# Patient Record
Sex: Female | Born: 1965 | Race: White | Hispanic: No | Marital: Single | Smoking: Current every day smoker
Health system: Southern US, Community
[De-identification: ages and names within clinical notes are randomized; demographics above are authoritative.]

## PROBLEM LIST (undated history)

## (undated) DIAGNOSIS — G43909 Migraine, unspecified, not intractable, without status migrainosus: Secondary | ICD-10-CM

## (undated) HISTORY — PX: BREAST ENHANCEMENT SURGERY: SHX7

## (undated) HISTORY — PX: OTHER SURGICAL HISTORY: SHX169

## (undated) HISTORY — PX: ABDOMINAL HYSTERECTOMY: SHX81

## (undated) HISTORY — PX: BREAST EXCISIONAL BIOPSY: SUR124

## (undated) HISTORY — PX: CHOLECYSTECTOMY: SHX55

---

## 1995-01-04 HISTORY — PX: AUGMENTATION MAMMAPLASTY: SUR837

## 1997-06-30 ENCOUNTER — Ambulatory Visit (HOSPITAL_COMMUNITY): Admission: RE | Admit: 1997-06-30 | Discharge: 1997-06-30 | Payer: Self-pay | Admitting: Urology

## 1998-02-11 ENCOUNTER — Ambulatory Visit (HOSPITAL_BASED_OUTPATIENT_CLINIC_OR_DEPARTMENT_OTHER): Admission: RE | Admit: 1998-02-11 | Discharge: 1998-02-11 | Payer: Self-pay | Admitting: Urology

## 1998-11-18 ENCOUNTER — Ambulatory Visit (HOSPITAL_COMMUNITY): Admission: RE | Admit: 1998-11-18 | Discharge: 1998-11-18 | Payer: Self-pay | Admitting: Urology

## 1999-09-15 ENCOUNTER — Ambulatory Visit (HOSPITAL_COMMUNITY): Admission: RE | Admit: 1999-09-15 | Discharge: 1999-09-15 | Payer: Self-pay | Admitting: Urology

## 2000-09-27 ENCOUNTER — Ambulatory Visit (HOSPITAL_BASED_OUTPATIENT_CLINIC_OR_DEPARTMENT_OTHER): Admission: RE | Admit: 2000-09-27 | Discharge: 2000-09-27 | Payer: Self-pay | Admitting: Urology

## 2001-06-12 ENCOUNTER — Emergency Department (HOSPITAL_COMMUNITY): Admission: EM | Admit: 2001-06-12 | Discharge: 2001-06-12 | Payer: Self-pay | Admitting: Emergency Medicine

## 2001-06-12 ENCOUNTER — Encounter: Payer: Self-pay | Admitting: Emergency Medicine

## 2001-09-19 ENCOUNTER — Ambulatory Visit (HOSPITAL_BASED_OUTPATIENT_CLINIC_OR_DEPARTMENT_OTHER): Admission: RE | Admit: 2001-09-19 | Discharge: 2001-09-19 | Payer: Self-pay | Admitting: Urology

## 2002-05-29 ENCOUNTER — Ambulatory Visit (HOSPITAL_BASED_OUTPATIENT_CLINIC_OR_DEPARTMENT_OTHER): Admission: RE | Admit: 2002-05-29 | Discharge: 2002-05-29 | Payer: Self-pay | Admitting: Urology

## 2003-03-24 ENCOUNTER — Ambulatory Visit (HOSPITAL_BASED_OUTPATIENT_CLINIC_OR_DEPARTMENT_OTHER): Admission: RE | Admit: 2003-03-24 | Discharge: 2003-03-24 | Payer: Self-pay | Admitting: Urology

## 2003-11-05 ENCOUNTER — Encounter: Admission: RE | Admit: 2003-11-05 | Discharge: 2003-11-05 | Payer: Self-pay | Admitting: Obstetrics and Gynecology

## 2004-01-15 ENCOUNTER — Ambulatory Visit (HOSPITAL_BASED_OUTPATIENT_CLINIC_OR_DEPARTMENT_OTHER): Admission: RE | Admit: 2004-01-15 | Discharge: 2004-01-15 | Payer: Self-pay | Admitting: Urology

## 2004-06-07 ENCOUNTER — Encounter: Admission: RE | Admit: 2004-06-07 | Discharge: 2004-06-07 | Payer: Self-pay | Admitting: Obstetrics and Gynecology

## 2004-06-23 ENCOUNTER — Encounter (INDEPENDENT_AMBULATORY_CARE_PROVIDER_SITE_OTHER): Payer: Self-pay | Admitting: Specialist

## 2004-06-23 ENCOUNTER — Ambulatory Visit (HOSPITAL_BASED_OUTPATIENT_CLINIC_OR_DEPARTMENT_OTHER): Admission: RE | Admit: 2004-06-23 | Discharge: 2004-06-23 | Payer: Self-pay | Admitting: General Surgery

## 2004-06-23 ENCOUNTER — Ambulatory Visit (HOSPITAL_COMMUNITY): Admission: RE | Admit: 2004-06-23 | Discharge: 2004-06-23 | Payer: Self-pay | Admitting: General Surgery

## 2004-12-08 ENCOUNTER — Encounter: Admission: RE | Admit: 2004-12-08 | Discharge: 2004-12-08 | Payer: Self-pay | Admitting: Obstetrics and Gynecology

## 2005-01-19 ENCOUNTER — Ambulatory Visit (HOSPITAL_BASED_OUTPATIENT_CLINIC_OR_DEPARTMENT_OTHER): Admission: RE | Admit: 2005-01-19 | Discharge: 2005-01-19 | Payer: Self-pay | Admitting: Urology

## 2005-11-09 ENCOUNTER — Ambulatory Visit (HOSPITAL_COMMUNITY): Admission: RE | Admit: 2005-11-09 | Discharge: 2005-11-09 | Payer: Self-pay | Admitting: Urology

## 2006-11-01 ENCOUNTER — Ambulatory Visit (HOSPITAL_BASED_OUTPATIENT_CLINIC_OR_DEPARTMENT_OTHER): Admission: RE | Admit: 2006-11-01 | Discharge: 2006-11-01 | Payer: Self-pay | Admitting: Urology

## 2007-05-23 ENCOUNTER — Encounter: Admission: RE | Admit: 2007-05-23 | Discharge: 2007-05-23 | Payer: Self-pay | Admitting: Obstetrics and Gynecology

## 2007-10-24 ENCOUNTER — Emergency Department (HOSPITAL_COMMUNITY): Admission: EM | Admit: 2007-10-24 | Discharge: 2007-10-24 | Payer: Self-pay | Admitting: Emergency Medicine

## 2007-12-24 ENCOUNTER — Ambulatory Visit (HOSPITAL_BASED_OUTPATIENT_CLINIC_OR_DEPARTMENT_OTHER): Admission: RE | Admit: 2007-12-24 | Discharge: 2007-12-24 | Payer: Self-pay | Admitting: Urology

## 2008-07-21 ENCOUNTER — Ambulatory Visit: Payer: Self-pay | Admitting: Diagnostic Radiology

## 2008-07-21 ENCOUNTER — Emergency Department (HOSPITAL_BASED_OUTPATIENT_CLINIC_OR_DEPARTMENT_OTHER): Admission: EM | Admit: 2008-07-21 | Discharge: 2008-07-21 | Payer: Self-pay | Admitting: Emergency Medicine

## 2008-07-29 ENCOUNTER — Ambulatory Visit (HOSPITAL_BASED_OUTPATIENT_CLINIC_OR_DEPARTMENT_OTHER): Admission: RE | Admit: 2008-07-29 | Discharge: 2008-07-29 | Payer: Self-pay | Admitting: Otolaryngology

## 2008-08-20 ENCOUNTER — Encounter: Admission: RE | Admit: 2008-08-20 | Discharge: 2008-08-20 | Payer: Self-pay | Admitting: Obstetrics and Gynecology

## 2009-02-09 ENCOUNTER — Ambulatory Visit (HOSPITAL_BASED_OUTPATIENT_CLINIC_OR_DEPARTMENT_OTHER): Admission: RE | Admit: 2009-02-09 | Discharge: 2009-02-09 | Payer: Self-pay | Admitting: Urology

## 2010-05-18 NOTE — Op Note (Signed)
NAMEJASMIN, WINBERRY NO.:  [id number]   MEDICAL RECORD NO.:  [id number]          PATIENT TYPE:  AMB   LOCATION:  [facility]                         FACILITY:  [facility]   PHYSICIAN:  Valetta Fuller, M.D.  DATE OF BIRTH:  01/12/1965   DATE OF PROCEDURE:  DATE OF DISCHARGE:                               OPERATIVE REPORT   PREOPERATIVE DIAGNOSIS:  Interstitial cystitis.   POSTOPERATIVE DIAGNOSIS:  Interstitial cystitis.   PROCEDURE PERFORMED:  Cystoscopy with hydraulic overdistention of the  bladder and installation of Clorpactin and Marcaine.   SURGEON:  Valetta Fuller, M.D.   ANESTHESIA:  General.   INDICATIONS:  Ms. Buckingham has had a very longstanding history of  interstitial cystitis.  She has undergone a multitude of cystoscopies  with hydraulic overdistention of her bladder.  She typically has her  bladder over-distended approximately once per year and this generally  results in nice durable response.  Her last hydraulic overdistention was  in October of 2008.  She recently presented with a flare of her  interstitial cystitis symptoms and requested a repeat hydraulic  overdistention of her bladder.  She presents now for that procedure.   The appropriate time-out was performed and the patient received  perioperative antibiotics.   TECHNIQUE AND FINDINGS:  The patient was brought to the operating room  where she had successful induction of general anesthesia.  She was  placed in the mid-lithotomy position and prepped and draped in usual  manner.  Cystoscopy was performed with an unremarkable bladder  initially.  She underwent hydraulic overdistention of her bladder to 100  cm of water pressure for 5 minutes.  Capacity was consistent at around  900 mL.  The patient did however have diffuse glomerular hemorrhaging  that I would deem 3-4+.  At the completion of her bladder decompression  the affluent from her bladder was grossly bloody.  She underwent a  repeat  hydraulic overdistention for an additional 2-3 minutes.  Her  bladder was then decompressed.  A red Robinson catheter was placed and  she had gentle instillation of standard Clorpactin solution, a few 100  mL at a time.  Her bladder was then irrigated and Marcaine was instilled  along with a B and O suppository.  The patient appeared to tolerate the  procedure well and there were no obvious complications or difficulties.  She was brought to recovery room in stable condition.      Valetta Fuller, M.D.  Electronically Signed     DSG/MEDQ  D:  12/24/2007  T:  12/25/2007  Job:  [id number]
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
## 2010-05-18 NOTE — Op Note (Signed)
[name], [name]               ACCOUNT NO.:  [id number]   MEDICAL RECORD NO.:  [id number]          PATIENT TYPE:  AMB   LOCATION:  [facility]                         FACILITY:  [facility]   PHYSICIAN:  Valetta Fuller, M.D.  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   PROCEDURE PERFORMED:  Cystoscopy with hydraulic overdistention of  bladder and installation of Clorpactin and Marcaine.   SURGEON:  Valetta Fuller, M.D.   ANESTHESIA:  General.   INDICATIONS:  [name] has had very longstanding chronic painful  bladder syndrome.  She has undergone 8 to 10 hydraulic overdistentions  of the bladder in the past, all of which have resulted in marked  symptomatic improvement which is generally durable for 9-12 months.  She  has had a recent flare-up of her symptoms and requested a repeat  hydraulic overdistention of her bladder with installation.   TECHNIQUE AND FINDINGS:  The patient was brought to the operating room  where she had successful induction of general anesthesia.  She was  placed lithotomy position, prepped and draped in usual manner.  Cystoscopy was unremarkable.  She underwent hydraulic overdistention of  her bladder to 1 meter of water pressure for five minutes.  Capacity was  approximately 800 to 900 mL.  There were diffuse glomerular hemorrhages.  A second distention was done for an additional few minutes.  The bladder  was then drained.  Clorpactin was instilled at a standard concentration  a few hundred mL  at a time under gravity drainage.  The bladder was  then emptied and Marcaine was instilled.  The patient appeared to  tolerate the procedure well and there were no obvious complications.  She was brought to recovery room in stable condition.           ______________________________  Valetta Fuller, M.D.  Electronically Signed     DSG/MEDQ  D:  11/08/2006  T:  11/09/2006  Job:  [id number]

## 2010-05-21 NOTE — Op Note (Signed)
   NAME:  Savannah [name], Savannah [name]                         ACCOUNT NO.:  [id number]   MEDICAL RECORD NO.:  [id number]                   PATIENT TYPE:  AMB   LOCATION:  [facility]                                 FACILITY:  [facility]   PHYSICIAN:  Valetta Fuller, M.D.               DATE OF BIRTH:  September 14, 1965   DATE OF PROCEDURE:  06/20/2002  DATE OF DISCHARGE:  05/29/2002                                 OPERATIVE REPORT   PREOPERATIVE DIAGNOSIS:  Interstitial cystitis.   POSTOPERATIVE DIAGNOSIS:  Interstitial cystitis.   PROCEDURE PERFORMED:  1. Cystoscopy.  2. Hydraulic overdistention of the bladder with instillation of Clorpactin.   SURGEON:  Valetta Fuller, M.D.   ANESTHESIA:  General.   INDICATIONS:  Ms. Kuntzman is a 46 year old female with an 8-10 year history  of interstitial cystitis.  Approximately every 9-12 months, she has a  cystoscopy and hydraulic overdistention of the bladder with usual marked  improvement in her voiding symptoms.  She has had a recent flare-up of her  symptoms and requested repeat procedure.   TECHNIQUE AND FINDINGS:  The patient was brought to the operating room where  she had successful induction of general anesthesia.  She was placed in  lithotomy position and prepped and draped in the usual manner.  Cystoscopy  revealed a normal bladder initially.  She underwent a hydraulic  overdistension to 80 cardiomyopathy of water pressure.  The capacity is  measured at 800 mL.  She did have 3-4+ diffuse glomerulations with typical  findings and similar capacity to previous procedures.  At the completion of  the procedure, she had instillation of Clorpactin along with some Marcaine  and Pyridium.  She appeared to tolerate the procedure well, and there were  no obvious complications.                                               Valetta Fuller, M.D.    DSG/MEDQ  D:  06/20/2002  T:  06/20/2002  Job:  [id number]

## 2010-05-21 NOTE — Op Note (Signed)
[name], [name]               ACCOUNT NO.:  [id number]   MEDICAL RECORD NO.:  [id number]          PATIENT TYPE:  AMB   LOCATION:  DSC                          FACILITY:  [facility]   PHYSICIAN:  Rose Phi. Maple Hudson, M.D.   DATE OF BIRTH:  1965-05-30   DATE OF PROCEDURE:  06/23/2004  DATE OF DISCHARGE:                                 OPERATIVE REPORT   PREOPERATIVE DIAGNOSIS:  Fibroadenoma of the left breast.   POSTOPERATIVE DIAGNOSIS:  Fibroadenoma of the left breast.   OPERATION:  Excision of left breast mass.   SURGEON:  Savannah [name], M.D.   ANESTHESIA:  General.   OPERATIVE PROCEDURE:  After suitable general anesthesia was induced, the  patient was placed in a supine position with the arms extended on the arm  board.  The left breast was prepped and draped in the usual fashion.  The  palpable nodule was underneath the areola at about the 5 o'clock position.  A short curved circumareolar incision was made and I dissected up an areolar  flap so I could palpate the nodule.  I then excised the nodule with the  cautery.  After excising it, we had good hemostasis.  I infiltrated the  tissue at 0.25% Marcaine.  The deeper tissue was then closed with 3-0 Vicryl  and the skin with subcuticular 4-0 Monocryl and Steri-Strips.  Dressing  applied.  The patient was transferred to recovery room in satisfactory  condition, having tolerated the procedure well.       PRY/MEDQ  D:  06/23/2004  T:  06/23/2004  Job:  [id number]

## 2010-05-21 NOTE — Op Note (Signed)
NAME:  Savannah [name], Savannah [name]                         ACCOUNT NO.:  [id number]   MEDICAL RECORD NO.:  [id number]                   PATIENT TYPE:  AMB   LOCATION:  [facility]                                 FACILITY:  [facility]   PHYSICIAN:  Valetta Fuller, M.D.               DATE OF BIRTH:  11/12/1965   DATE OF PROCEDURE:  03/24/2003  DATE OF DISCHARGE:                                 OPERATIVE REPORT   PREOPERATIVE DIAGNOSIS:  Interstitial cystitis.   POSTOPERATIVE DIAGNOSIS:  Interstitial cystitis.   OPERATION/PROCEDURE:  1. Cystoscopy.  2. Hydraulic overdistention of the bladder.  3. Instillation of Clorpactin, Marcaine and Pyridium.   SURGEON:  Valetta Fuller, M.D.   ANESTHESIA:  General.   INDICATIONS:  Mrs. Laidlaw is a 45 year old female with about a 10-year  history of interstitial cystitis.  She has undergone approximately 9-10  hydraulic overdistentions of her bladder in the past.  She has had a mildly  reduced bladder capacity that has not really changed much over the last 8-10  years.  She typically gets excellent response to hydraulic overdistentions  which tend to be durable for between 9-12 months.  She has had a typical  flare-up of her symptoms recently and requested a repeat procedure.   DESCRIPTION OF PROCEDURE:  The patient was brought to the operating room  where she had the successful induction of general anesthesia.  She was  placed in the lithotomy position and prepped and draped in the usual manner.  Cystoscopy initially revealed no abnormalities.  She underwent hydraulic  overdistention to approximately 87 cm of water pressure for five minutes.  Capacity was stable at 800 mL.  She continued to have 3-4+ very severe,  diffuse, glomerular hemorrhaging.  A repeat distention was performed.  The  bladder was then emptied.  Clorpactin was instilled under gravity drainage  under a standard concentration and then drained from her bladder.  Marcaine  and Pyridium were placed  at the end of the procedure.  The patient appeared  to tolerate the procedure well.  There were no obvious complications.                                               Valetta Fuller, M.D.    DSG/MEDQ  D:  03/24/2003  T:  03/24/2003  Job:  [id number]

## 2010-05-21 NOTE — Op Note (Signed)
NAMEJAYDALEE, BARDWELL               ACCOUNT NO.:  [id number]   MEDICAL RECORD NO.:  [id number]          PATIENT TYPE:  AMB   LOCATION:  [facility]                         [facility]ACILITY:  [facility]   PHYSICIAN:  Valetta Fuller, M.D.  DATE OF BIRTH:  Jun 28, 1965   DATE OF PROCEDURE:  DATE OF DISCHARGE:                                 OPERATIVE REPORT   PREOPERATIVE DIAGNOSIS:  Interstitial cystitis.   POSTOPERATIVE DIAGNOSIS:  Interstitial cystitis.   PROCEDURE PERFORMED:  Cystoscopy, hydraulic overdistention of the bladder,  instillation of Clorpactin and Marcaine.   SURGEON:  Valetta Fuller, M.D.   ANESTHESIA:  General.   INDICATIONS:  Ms. Kopec is a 45 year old female who has had at least a 10-  year history of interstitial cystitis.  She has had approximately 10  hydraulic overdistentions of her bladder in the past.  She has been noted to  have a mildly reduced bladder capacity and fairly severe diffuse glomerular  hemorrhaging.  She typically gets an excellent response to her cysto and HOD  which typically last for 9-12 months.  She has had a recent flareup of her  symptoms and requested a repeat procedure.  This was last done in March  2005.   TECHNIQUE AND FINDINGS:  The patient was brought to the operating room,  where she had successful induction of general anesthesia.  She was placed in  the lithotomy position and prepped and draped in the usual manner.  Cystoscopy revealed unremarkable bladder initially.  She underwent hydraulic  overdistention of her bladder to approximately 100 cmH2O pressure for five  minutes.  Capacity was measured at 900 cc, which was stable to slightly  increased.  She did have very severe 4+ diffuse glomerular hemorrhaging,  with very bloody effluent from her bladder.  A repeat distention was  performed for an additional two to three minutes.  The bladder was then  emptied.  A red Robinson catheter was placed, and Clorpactin was instilled  under gravity  drainage a few hundred cc at a time.  At the end of that, some  Marcaine was placed in her bladder for symptomatic relief.  The patient  appeared to tolerate the procedure well.  There were no obvious  complications.      DSG/MEDQ  D:  01/15/2004  T:  01/15/2004  Job:  [id number]

## 2010-05-21 NOTE — Op Note (Signed)
   NAME:  Savannah [name], Savannah [name]                         ACCOUNT NO.:  [id number]   MEDICAL RECORD NO.:  [id number]                   PATIENT TYPE:  AMB   LOCATION:  [facility]                                 FACILITY:  [facility]   PHYSICIAN:  Valetta Fuller, M.D.               DATE OF BIRTH:  June 10, 1965   DATE OF PROCEDURE:  09/19/2001  DATE OF DISCHARGE:                                 OPERATIVE REPORT   PREOPERATIVE DIAGNOSIS:  Interstitial cystitis.   POSTOPERATIVE DIAGNOSIS:  Interstitial cystitis.   PROCEDURE PERFORMED:  Urethral dilation, cystoscopy, hydraulic  overdistention of the bladder with instillation of Clorpactin, Pyridium and  Marcaine..   SURGEON:  Valetta Fuller, M.D.   ANESTHESIA:  General.   INDICATIONS:  The patient is a 45 year old female.  She has been diagnosed  with interstitial cystitis for approximately 5-6 years.  She has undergone  five previous hydraulic overdistentions of her bladder.  This has always  shown the same findings, which is fairly diffuse glomerular hemorrhages,  with slightly reduced bladder capacity.  She has already responded extremely  well, and the durability of her response has tended to be 9-12 months.  Approximately one year ago she had this procedure.  She recently had a  recurrence of her symptoms, with a negative urine and wanted an additional  procedure performed.   TECHNIQUE AND FINDINGS:  The patient was brought to the operating room,  where she had successful induction of general anesthesia.  She was prepped  and draped in the usual manner.  Urethral dilation was done to 32-French.  Cystoscopy revealed no abnormalities.  She underwent hydraulic  overdistention to 80 cm of water pressure for five minutes.  Bladder  capacity was 800 to 850 cc.  She had, what I would call, 4+ glomerular  hemorrhaging, with a fairly bloody effluent from her bladder.  We then  instilled Clorpactin to gravity drainage a few 100 cc at a time, under a  standard concentration and then some Pyridium and Marcaine.  The patient  appeared to tolerated the procedure well.  She had no obvious complications  or problems.                                               Valetta Fuller, M.D.    DSG/MEDQ  D:  09/19/2001  T:  09/19/2001  Job:  [id number]

## 2010-05-21 NOTE — Op Note (Signed)
[facility]  Patient:    Savannah [name], Savannah [name] Visit Number: [id number] MRN: [id number]          Service Type: NES Location: [facility] Attending Physician:  Thermon Leyland Dictated by:   Barron Alvine, M.D. Proc. Date: 09/27/00 Admit Date:  09/27/2000                             Operative Report  PREOPERATIVE DIAGNOSIS:  Interstitial cystitis.  POSTOPERATIVE DIAGNOSIS:  Interstitial cystitis.  OPERATION PERFORMED:  Urethral dilation, cystoscopy, hydraulic overdistention of the bladder, instillation of Clorpactin, Marcaine and Pyridium.  SURGEON:  Barron Alvine, M.D.  ANESTHESIA:  General.  INDICATIONS FOR PROCEDURE:  Ms. Wilbourne is a 45 year old female.  She has approximately a 5 to 6-year history of interstitial cystitis, she has undergone approximately four to five hydraulic overdistentions in her bladder in the past with excellent symptom relief that is usually of 9 to 12 months durability.  She has had recurrent symptoms and requested another hydraulic overdistention of the bladder.  DESCRIPTION OF PROCEDURE:  The patient was brought to the operating room where she had successful induction of general anesthesia.  She was placed in lithotomy position and was prepped and draped in the usual manner.  Urethral dilation was performed to 32 [place].  The patient then underwent cystoscopy which was unremarkable.  She had hydraulic overdistention of her bladder performed to approximately 80 cm of water pressure. The distention was held for five minutes.  Her capacity was measured at 800 cc.  Her bladder actually had much fewer in the way of glomerulations than she has in the past.  We then repeated the distention.  We then emptied the bladder and instilled Clorpactin in a standard concentration under gravity instillation a few hundred ccs at a time.  At the end of the procedure, we placed some Marcaine and Pyridium.  She was brought to the recovery room  in stable condition. Dictated by:   Barron Alvine, M.D. Attending Physician:  Thermon Leyland DD:  09/27/00 TD:  09/27/00 Job: [id number]

## 2010-05-21 NOTE — Op Note (Signed)
Savannah [name], Savannah [name]               ACCOUNT NO.:  [id number]   MEDICAL RECORD NO.:  [id number]          PATIENT TYPE:  AMB   LOCATION:  [facility]                         FACILITY:  [facility]   PHYSICIAN:  Valetta Fuller, M.D.  DATE OF BIRTH:  09-Jul-1965   DATE OF PROCEDURE:  01/19/2005  DATE OF DISCHARGE:                                 OPERATIVE REPORT   PREOPERATIVE DIAGNOSIS:  Interstitial cystitis.   POSTOPERATIVE DIAGNOSIS:  Interstitial cystitis.   PROCEDURE PERFORMED:  Cystoscopy, hydraulic over-distension of the bladder  and installation of Clorpactin and Marcaine.   SURGEON:  Valetta Fuller, M.D.   ANESTHESIA:  General.   INDICATIONS:  [name] is a 45 year old female who has a 10 to 12-year  history of interstitial cystitis. She has had at least 10 hydraulic over-  distensions of her bladder in the past. She has had moderately reduced  bladder capacity was severe diffuse glomerular hemorrhaging. She gets an  excellent response hydraulic over-distension which is typically durable for  9-12 months. Her last  hydraulic over-distension was done a year ago. She  requested repeat procedure because of flare-up of her symptoms.   TECHNIQUE AND FINDINGS:  The patient was brought to the operating room where  she had successful induction of general anesthesia. She was placed in  lithotomy position, prepped and draped usual manner. Initial cystoscopy was  unremarkable. She underwent hydraulic over-stents of the bladder to 100 cm  of water pressure for five minutes. Capacity was 850 mL, which is fairly  stable. The patient had diffuse 4+ glomerular hemorrhaging with very bloody  affluent from her bladder. We did a repeat hydraulic over-distension and  then drained her bladder. We instilled Clorpactin under a standard  concentration, a few 100 mL at that time. Marcaine was instilled in her  bladder. She appeared to tolerate the procedure well.  There were no obvious   complications.           ______________________________  Valetta Fuller, M.D.  Electronically Signed     DSG/MEDQ  D:  01/19/2005  T:  01/19/2005  Job:  [id number]

## 2010-05-21 NOTE — Op Note (Signed)
Savannah [name], Savannah [name] NO.:  [id number]   MEDICAL RECORD NO.:  [id number]          PATIENT TYPE:  AMB   LOCATION:  DAY                          FACILITY:  [facility]   PHYSICIAN:  Valetta Fuller, M.D.  DATE OF BIRTH:  [birth year]   DATE OF PROCEDURE:  11/09/2005  DATE OF DISCHARGE:                               OPERATIVE REPORT   PREOPERATIVE DIAGNOSIS:  Chronic interstitial cystitis/pelvic pain  syndrome.   POSTOPERATIVE DIAGNOSIS:  Chronic interstitial cystitis/pelvic pain  syndrome.   PROCEDURE PERFORMED:  Urethral dilation, cystoscopy with hydraulic  overdistention of bladder and installation of Clorpactin and Marcaine.   SURGEON:  Valetta Fuller, M.D.   ANESTHESIA:  General.   INDICATIONS:  [name] is a 45 year old female who has had diagnosis  of interstitial cystitis for least 10 years.  She has had a multitude of  hydraulic overdistentions of her bladder which help her symptoms greatly  with the durability generally between nine and 12 months.  She has had a  flare-up of her symptoms.  Her last hydraulic overdistention was  approximately nine to 10 months ago.  She requested another procedure.   TECHNIQUE AND FINDINGS:  The patient was brought to the operating room  where she had successful induction of general anesthesia.  She was  placed in lithotomy position and prepped, draped in the usual manner.  Urethral dilation was performed and no evidence of real stenosis was  noted.  Initial cystoscopy was unremarkable.  She underwent hydraulic  overdistention of bladder at 100 cm of water pressure for 5 minutes.  Capacity was measured at 700-750 mL.  She had widespread diffuse grade 4  glomerular hemorrhaging with a bloody effluent of urine at the  completion of the procedure.  She underwent a repeat hydraulic  overdistention for an additional few minutes and then her bladder was  emptied.  Clorpactin was instilled in her bladder a few hundred ccs at  a  time under gravity drainage.  Her bladder was then drained and Marcaine  was placed.  The patient appeared to tolerate the procedure well and no  obvious complications or difficulties.           ______________________________  Valetta Fuller, M.D.  Electronically Signed     DSG/MEDQ  D:  11/10/2005  T:  11/10/2005  Job:  [id number]

## 2010-05-21 NOTE — Op Note (Signed)
[facility]  Patient:    Savannah [name], Savannah [name]                      MRN: [id number] Proc. Date: 09/15/99 Adm. Date:  [id number] Attending:  Thermon Leyland                           Operative Report  PREOPERATIVE DIAGNOSES:  Interstitial cystitis.  POSTOPERATIVE DIAGNOSES:  Interstitial cystitis.  PROCEDURE:  Urethral dilation, cystoscopy, hydraulic over distention of the bladder, installation of Clorpactin and Marcaine.  SURGEON:  Dr. Isabel Caprice.  ANESTHESIA:  General.  INDICATIONS FOR PROCEDURE:  Savannah [name] is a 45 year old with longstanding interstitial cystitis. She has had a similar procedure approximately 5 times in the past all with good response. She has had a flare-up of her symptoms and is now presenting for a repeat procedure.  TECHNIQUE:  The patient was brought to the operating room where she had the successful induction of general anesthesia. She was then placed in lithotomy position and prepped and draped in the usual manner. Her urethra was gently dilated to 30 french. Cystoscopy was then performed. She underwent hydraulic over distention to 80 cm of water pressure which was held for 5 minutes. Bladder capacity was measured at 700 cc which was fairly consistent with previous evaluations. Upon emptying her bladder, she had diffuse 3-4+ glomerulations. She underwent a repeat hydrodistention. The patient then had installation of Marcaine in her bladder. The procedure was tolerated well and she was brought to the recovery room in stable condition. DD:  09/15/99 TD:  09/16/99 Job: [id number]

## 2010-10-04 LAB — CBC
Hemoglobin: 15.1 — ABNORMAL HIGH
MCHC: 34.2
MCV: 93
RBC: 4.74
WBC: 6.8

## 2010-10-04 LAB — COMPREHENSIVE METABOLIC PANEL
ALT: 21
Albumin: 4.4
CO2: 27
Calcium: 9.7
GFR calc Af Amer: >60
Glucose, Bld: 98
Total Bilirubin: 1

## 2010-10-04 LAB — DIFFERENTIAL: Lymphocytes Relative: 36

## 2010-10-07 LAB — POCT HEMOGLOBIN-HEMACUE: Hemoglobin: 13.3 g/dL (ref 12.0–15.0)

## 2010-10-13 LAB — POCT HEMOGLOBIN-HEMACUE: Operator id: 268271

## 2011-05-02 ENCOUNTER — Other Ambulatory Visit: Payer: Self-pay | Admitting: Obstetrics and Gynecology

## 2011-05-02 DIAGNOSIS — Z1231 Encounter for screening mammogram for malignant neoplasm of breast: Secondary | ICD-10-CM

## 2011-05-03 ENCOUNTER — Ambulatory Visit (INDEPENDENT_AMBULATORY_CARE_PROVIDER_SITE_OTHER): Payer: Self-pay | Admitting: *Deleted

## 2011-05-03 ENCOUNTER — Other Ambulatory Visit: Payer: Self-pay | Admitting: Obstetrics and Gynecology

## 2011-05-03 ENCOUNTER — Ambulatory Visit (HOSPITAL_COMMUNITY): Payer: Self-pay | Attending: Obstetrics and Gynecology

## 2011-05-03 VITALS — BP 105/70 | HR 72 | Temp 97.1°F | Ht 63.0 in | Wt 112.7 lb

## 2011-05-03 DIAGNOSIS — N6312 Unspecified lump in the right breast, upper inner quadrant: Secondary | ICD-10-CM

## 2011-05-03 DIAGNOSIS — Z1239 Encounter for other screening for malignant neoplasm of breast: Secondary | ICD-10-CM

## 2011-05-03 DIAGNOSIS — N6310 Unspecified lump in the right breast, unspecified quadrant: Secondary | ICD-10-CM

## 2011-05-03 DIAGNOSIS — N63 Unspecified lump in unspecified breast: Secondary | ICD-10-CM

## 2011-05-03 NOTE — Progress Notes (Signed)
Complaints of right breast lump.  Pap Smear:    Pap smear not performed today. Per patient last Pap smear was March 2013 by Dr. Jackelyn Knife and normal. Per patient she has no history of abnormal Pap smears. Patient has a history of a TAH August 1996 due to chronic pelvic pain. No Pap smear results in EPIC.   Physical exam: Breasts Breasts symmetrical. No skin abnormalities bilateral breasts. No nipple retraction bilateral breasts. No nipple discharge bilateral breasts. No lymphadenopathy. No lumps palpated left breast. Palpated lump in the right breast around 1 o'clock 6 cm from the areola. No complaints of pain or tenderness on exam. Patient referred to the [facility] for Diagnostic Mammogram and possible right breast ultrasound. Appointment scheduled for Wednesday, May 04, 2011 at 1430.     Pelvic/Bimanual No Pap smear completed today since patient has a history of a TAH for benign reasons. Pap smear not indicated per BCCCP guidelines.

## 2011-05-03 NOTE — Patient Instructions (Signed)
Taught patient how to perform BSE and gave educational materials to take home. Let patient know she will not need any further Pap smears due to history of TAH for benign reasons. Talked with patient about quitting smoking. Told patient about free smoking cessation classes offered at the [facility]. Patient stated not ready to quit now. Patient referred to the [facility] for Diagnostic Mammogram and possible right breast ultrasound. Appointment scheduled for Wednesday, May 04, 2011 at 1430. Patient aware of appointment and will be there. Let patient know will follow up with her within the next couple weeks with results. Patient verbalized understanding.

## 2011-05-04 ENCOUNTER — Ambulatory Visit
Admission: RE | Admit: 2011-05-04 | Discharge: 2011-05-04 | Disposition: A | Discharge status: Home / Self Care | Payer: No Typology Code available for payment source | Source: Ambulatory Visit | Attending: Obstetrics and Gynecology | Admitting: Obstetrics and Gynecology

## 2011-05-04 DIAGNOSIS — N6312 Unspecified lump in the right breast, upper inner quadrant: Secondary | ICD-10-CM

## 2011-05-20 ENCOUNTER — Other Ambulatory Visit: Payer: Self-pay | Admitting: Obstetrics and Gynecology

## 2011-05-20 DIAGNOSIS — R928 Other abnormal and inconclusive findings on diagnostic imaging of breast: Secondary | ICD-10-CM

## 2014-07-21 ENCOUNTER — Emergency Department (HOSPITAL_BASED_OUTPATIENT_CLINIC_OR_DEPARTMENT_OTHER)
Admission: EM | Admit: 2014-07-21 | Discharge: 2014-07-21 | Disposition: A | Discharge status: Home / Self Care | Payer: Self-pay | Attending: Emergency Medicine | Admitting: Emergency Medicine

## 2014-07-21 ENCOUNTER — Encounter (HOSPITAL_BASED_OUTPATIENT_CLINIC_OR_DEPARTMENT_OTHER): Payer: Self-pay | Admitting: *Deleted

## 2014-07-21 DIAGNOSIS — M5442 Lumbago with sciatica, left side: Secondary | ICD-10-CM | POA: Insufficient documentation

## 2014-07-21 DIAGNOSIS — Z720 Tobacco use: Secondary | ICD-10-CM | POA: Insufficient documentation

## 2014-07-21 LAB — URINALYSIS, ROUTINE W REFLEX MICROSCOPIC
BILIRUBIN URINE: NEGATIVE
Glucose, UA: NEGATIVE mg/dL
HGB URINE DIPSTICK: NEGATIVE
KETONES UR: NEGATIVE mg/dL
Leukocytes, UA: NEGATIVE
NITRITE: NEGATIVE
PROTEIN: NEGATIVE mg/dL
Specific Gravity, Urine: 1.021 (ref 1.005–1.030)
UROBILINOGEN UA: 0.2 mg/dL (ref 0.0–1.0)
pH: 5.5 (ref 5.0–8.0)

## 2014-07-21 MED ORDER — TRAMADOL HCL 50 MG PO TABS: 50.0000 mg | ORAL_TABLET | Freq: Four times a day (QID) | ORAL | Status: DC | PRN

## 2014-07-21 MED ORDER — NAPROXEN 500 MG PO TABS: 500.0000 mg | ORAL_TABLET | Freq: Two times a day (BID) | ORAL | Status: DC

## 2014-07-21 MED ORDER — DEXAMETHASONE SODIUM PHOSPHATE 10 MG/ML IJ SOLN
10.0000 mg | Freq: Once | INTRAMUSCULAR | Status: AC
  Administered 2014-07-21: 10 mg via INTRAMUSCULAR
  Filled 2014-07-21: qty 1

## 2014-07-21 MED ORDER — HYDROCODONE-ACETAMINOPHEN 5-325 MG PO TABS
2.0000 | ORAL_TABLET | Freq: Once | ORAL | Status: AC
Start: 2014-07-21 — End: 2014-07-21
  Administered 2014-07-21: 2 via ORAL
  Filled 2014-07-21: qty 2

## 2014-07-21 NOTE — ED Provider Notes (Signed)
CSN: [id number]     Arrival date & time 07/21/14  1108 History   First MD Initiated Contact with Patient 07/21/14 1128     Chief Complaint  Patient presents with  . Back Pain     (Consider location/radiation/quality/duration/timing/severity/associated sxs/prior Treatment) HPI Comments: 49 year old female complaining of left-sided low back pain 1 week. States she was doing a lot of housework one week ago when the pain started and has remained constant. Pain worse with certain movements, laying on her left side, radiates to her left hip and down her left leg with associated foot numbness. Pain rated 8/10. States this feels like a sciatica she's had in the past. Tried taking Aleve, ibuprofen and Goody powder with minimal relief. Denies loss of control of bowels or bladder saddle anesthesia. Denies fever or chills.  Patient is a 49 y.o. female presenting with back pain. The history is provided by the patient.  Back Pain Associated symptoms: numbness     History reviewed. No pertinent past medical history. Past Surgical History  Procedure Laterality Date  . Cesarean section    . Abdominal hysterectomy    . Cholecystectomy    . Breast enhancement surgery    . Breast tumors     Family History  Problem Relation Age of Onset  . Diabetes Father    History  Substance Use Topics  . Smoking status: Current Every Day Smoker -- 1.00 packs/day for 28 years  . Smokeless tobacco: Never Used  . Alcohol Use: 6.0 oz/week    10 Cans of beer per week   OB History    Gravida Para Term Preterm AB TAB SAB Ectopic Multiple Living   Review of Systems  Musculoskeletal: Positive for back pain.  Neurological: Positive for numbness.  All other systems reviewed and are negative.     Allergies  Review of patient's allergies indicates no known allergies.  Home Medications   Prior to Admission medications   Medication Sig Start Date End Date Taking? Authorizing Provider   naproxen (NAPROSYN) 500 MG tablet Take 1 tablet (500 mg total) by mouth 2 (two) times daily. 07/21/14   Treyvon Blahut M Homero Hyson, PA-C  traMADol (ULTRAM) 50 MG tablet Take 1 tablet (50 mg total) by mouth every 6 (six) hours as needed. 07/21/14   Jahziel Sinn M Tarrell Debes, PA-C   BP 115/76 mmHg  Pulse 79  Temp(Src) 98.3 F (36.8 C) (Oral)  Resp 20  Ht  (1.575 m)  Wt 112 lb (50.803 kg)  BMI 20.48 kg/m2  SpO2 100% Physical Exam  Constitutional: She is oriented to person, place, and time. She appears well-developed and well-nourished. No distress.  HENT:  Head: Normocephalic and atraumatic.  Mouth/Throat: Oropharynx is clear and moist.  Eyes: Conjunctivae are normal.  Neck: Normal range of motion. Neck supple. No spinous process tenderness and no muscular tenderness present.  Cardiovascular: Normal rate, regular rhythm and normal heart sounds.   Pulmonary/Chest: Effort normal and breath sounds normal. No respiratory distress.  Musculoskeletal: She exhibits no edema.  TTP L SI joint and L buttock. Positive SLR on L.  Neurological: She is alert and oriented to person, place, and time. She has normal strength.  Strength lower extremities 5/5 and equal bilateral. Sensation intact. Normal gait.  Skin: Skin is warm and dry. No rash noted. She is not diaphoretic.  Psychiatric: She has a normal mood and affect. Her behavior is normal.  Nursing note  and vitals reviewed.   ED Course  Procedures (including critical care time) Labs Review Labs Reviewed  URINALYSIS, ROUTINE W REFLEX MICROSCOPIC (NOT AT [facility])    Imaging Review No results found.   EKG Interpretation None      MDM   Final diagnoses:  Left-sided low back pain with left-sided sciatica   NAD. AF VSS. No red flags concerning patient's back pain. No s/s of central cord compression or cauda equina. Lower extremities are neurovascularly intact and patient is ambulating without difficulty. No focal neurologic deficits. Exam consistent with  left-sided sciatica. IM Decadron given in the ED. Discharge home with tramadol and naproxen. Advised rest, ice/heat. Follow-up with PCP. Stable for discharge. Return precautions given. Patient states understanding of treatment care plan and is agreeable.  Kathrynn SpeedRobyn M Karina Lenderman, PA-C 07/21/14 1212  Purvis SheffieldForrest Harrison, MD 07/21/14 [phone]

## 2014-07-21 NOTE — ED Notes (Signed)
Lower back pain for a week. Hx of sciatica. Pain goes into her left hip and leg.

## 2014-07-21 NOTE — Discharge Instructions (Signed)
Take both naproxen and tramadol as directed for pain. Rest, avoid heavy lifting or hard physical activity for the next few days.  Sciatica Sciatica is pain, weakness, numbness, or tingling along the path of the sciatic nerve. The nerve starts in the lower back and runs down the back of each leg. The nerve controls the muscles in the lower leg and in the back of the knee, while also providing sensation to the back of the thigh, lower leg, and the sole of your foot. Sciatica is a symptom of another medical condition. For instance, nerve damage or certain conditions, such as a herniated disk or bone spur on the spine, pinch or put pressure on the sciatic nerve. This causes the pain, weakness, or other sensations normally associated with sciatica. Generally, sciatica only affects one side of the body. CAUSES   Herniated or slipped disc.  Degenerative disk disease.  A pain disorder involving the narrow muscle in the buttocks (piriformis syndrome).  Pelvic injury or fracture.  Pregnancy.  Tumor (rare). SYMPTOMS  Symptoms can vary from mild to very severe. The symptoms usually travel from the low back to the buttocks and down the back of the leg. Symptoms can include:  Mild tingling or dull aches in the lower back, leg, or hip.  Numbness in the back of the calf or sole of the foot.  Burning sensations in the lower back, leg, or hip.  Sharp pains in the lower back, leg, or hip.  Leg weakness.  Severe back pain inhibiting movement. These symptoms may get worse with coughing, sneezing, laughing, or prolonged sitting or standing. Also, being overweight may worsen symptoms. DIAGNOSIS  Your caregiver will perform a physical exam to look for common symptoms of sciatica. He or she may ask you to do certain movements or activities that would trigger sciatic nerve pain. Other tests may be performed to find the cause of the sciatica. These may include:  Blood tests.  X-rays.  Imaging tests, such  as an MRI or CT scan. TREATMENT  Treatment is directed at the cause of the sciatic pain. Sometimes, treatment is not necessary and the pain and discomfort goes away on its own. If treatment is needed, your caregiver may suggest:  Over-the-counter medicines to relieve pain.  Prescription medicines, such as anti-inflammatory medicine, muscle relaxants, or narcotics.  Applying heat or ice to the painful area.  Steroid injections to lessen pain, irritation, and inflammation around the nerve.  Reducing activity during periods of pain.  Exercising and stretching to strengthen your abdomen and improve flexibility of your spine. Your caregiver may suggest losing weight if the extra weight makes the back pain worse.  Physical therapy.  Surgery to eliminate what is pressing or pinching the nerve, such as a bone spur or part of a herniated disk. HOME CARE INSTRUCTIONS   Only take over-the-counter or prescription medicines for pain or discomfort as directed by your caregiver.  Apply ice to the affected area for 20 minutes, 3-4 times a day for the first 48-72 hours. Then try heat in the same way.  Exercise, stretch, or perform your usual activities if these do not aggravate your pain.  Attend physical therapy sessions as directed by your caregiver.  Keep all follow-up appointments as directed by your caregiver.  Do not wear high heels or shoes that do not provide proper support.  Check your mattress to see if it is too soft. A firm mattress may lessen your pain and discomfort. SEEK IMMEDIATE MEDICAL CARE  IF:   You lose control of your bowel or bladder (incontinence).  You have increasing weakness in the lower back, pelvis, buttocks, or legs.  You have redness or swelling of your back.  You have a burning sensation when you urinate.  You have pain that gets worse when you lie down or awakens you at night.  Your pain is worse than you have experienced in the past.  Your pain is  lasting longer than 4 weeks.  You are suddenly losing weight without reason. MAKE SURE YOU:  Understand these instructions.  Will watch your condition.  Will get help right away if you are not doing well or get worse. Document Released: 12/14/2000 Document Revised: 06/21/2011 Document Reviewed: 05/01/2011 [facility] Patient Information 2015 [place], [place]. This information is not intended to replace advice given to you by your health care provider. Make sure you discuss any questions you have with your health care provider.  Radicular Pain Radicular pain in either the arm or leg is usually from a bulging or herniated disk in the spine. A piece of the herniated disk may press against the nerves as the nerves exit the spine. This causes pain which is felt at the tips of the nerves down the arm or leg. Other causes of radicular pain may include:  Fractures.  Heart disease.  Cancer.  An abnormal and usually degenerative state of the nervous system or nerves (neuropathy). Diagnosis may require CT or MRI scanning to determine the primary cause.  Nerves that start at the neck (nerve roots) may cause radicular pain in the outer shoulder and arm. It can spread down to the thumb and fingers. The symptoms vary depending on which nerve root has been affected. In most cases radicular pain improves with conservative treatment. Neck problems may require physical therapy, a neck collar, or cervical traction. Treatment may take many weeks, and surgery may be considered if the symptoms do not improve.  Conservative treatment is also recommended for sciatica. Sciatica causes pain to radiate from the lower back or buttock area down the leg into the foot. Often there is a history of back problems. Most patients with sciatica are better after 2 to 4 weeks of rest and other supportive care. Short term bed rest can reduce the disk pressure considerably. Sitting, however, is not a good position since this increases  the pressure on the disk. You should avoid bending, lifting, and all other activities which make the problem worse. Traction can be used in severe cases. Surgery is usually reserved for patients who do not improve within the first months of treatment. Only take over-the-counter or prescription medicines for pain, discomfort, or fever as directed by your caregiver. Narcotics and muscle relaxants may help by relieving more severe pain and spasm and by providing mild sedation. Cold or massage can give significant relief. Spinal manipulation is not recommended. It can increase the degree of disc protrusion. Epidural steroid injections are often effective treatment for radicular pain. These injections deliver medicine to the spinal nerve in the space between the protective covering of the spinal cord and back bones (vertebrae). Your caregiver can give you more information about steroid injections. These injections are most effective when given within two weeks of the onset of pain.  You should see your caregiver for follow up care as recommended. A program for neck and back injury rehabilitation with stretching and strengthening exercises is an important part of management.  SEEK IMMEDIATE MEDICAL CARE IF:  You develop increased pain,  weakness, or numbness in your arm or leg.  You develop difficulty with bladder or bowel control.  You develop abdominal pain. Document Released: 01/28/2004 Document Revised: 03/14/2011 Document Reviewed: 04/14/2008 [facility] Patient Information 2015 [place], [place]. This information is not intended to replace advice given to you by your health care provider. Make sure you discuss any questions you have with your health care provider.

## 2014-09-29 ENCOUNTER — Encounter (HOSPITAL_BASED_OUTPATIENT_CLINIC_OR_DEPARTMENT_OTHER): Payer: Self-pay | Admitting: *Deleted

## 2014-09-29 ENCOUNTER — Emergency Department (HOSPITAL_BASED_OUTPATIENT_CLINIC_OR_DEPARTMENT_OTHER)
Admission: EM | Admit: 2014-09-29 | Discharge: 2014-09-29 | Disposition: A | Discharge status: Home / Self Care | Payer: Self-pay | Attending: Emergency Medicine | Admitting: Emergency Medicine

## 2014-09-29 DIAGNOSIS — Y9389 Activity, other specified: Secondary | ICD-10-CM | POA: Insufficient documentation

## 2014-09-29 DIAGNOSIS — Y998 Other external cause status: Secondary | ICD-10-CM | POA: Insufficient documentation

## 2014-09-29 DIAGNOSIS — S76012A Strain of muscle, fascia and tendon of left hip, initial encounter: Secondary | ICD-10-CM | POA: Insufficient documentation

## 2014-09-29 DIAGNOSIS — Z791 Long term (current) use of non-steroidal anti-inflammatories (NSAID): Secondary | ICD-10-CM | POA: Insufficient documentation

## 2014-09-29 DIAGNOSIS — S39012D Strain of muscle, fascia and tendon of lower back, subsequent encounter: Secondary | ICD-10-CM | POA: Insufficient documentation

## 2014-09-29 DIAGNOSIS — Y9289 Other specified places as the place of occurrence of the external cause: Secondary | ICD-10-CM | POA: Insufficient documentation

## 2014-09-29 DIAGNOSIS — Z720 Tobacco use: Secondary | ICD-10-CM | POA: Insufficient documentation

## 2014-09-29 DIAGNOSIS — X58XXXA Exposure to other specified factors, initial encounter: Secondary | ICD-10-CM | POA: Insufficient documentation

## 2014-09-29 NOTE — Discharge Instructions (Signed)
Adductor Muscle Strain with Rehab  The adductor muscles of the thigh are responsible for moving the leg across the body and are susceptible to muscle strains. A strain is an injury to a muscle or a tendon that attaches the muscle to a bone. Strains of the adductor muscles occur where the muscle tendons attach to the pelvic bone. A muscle strain may be a complete or partial tear of the muscle and may involve one or more of the adductor muscles. These strains are usually classified as a grade 1 or 2 strain. A grade 1 strain has no obvious sign of tearing or stretching of the muscle or tendon, but may include significant inflammation. A grade 2 strain is a moderate strain in which the muscle or tendon has been partially torn and has been stretched. Grade 2 strains are usually accompanied with loss of strength. A grade 3 muscle strain rarely occurs in the adductor muscles. A grade 3 strain is a complete tear of the muscle or tendon. SYMPTOMS   Occasionally there is a sudden "pop" felt or heard in the groin or inner thigh at the time of injury.  There may be pain, tenderness, swelling, warmth, or redness over the inner thigh and groin. This may be worsened by moving the hip (especially when spreading the legs or hips, pushing the legs against each other or kicking with the affected leg). There may be bruising (contusion) in the groin and inner thigh within 48 hours following the injury.  There may be loss of fullness of the muscle with complete rupture (uncommon).  Muscle spasm in the groin and inner thigh can occur. CAUSES   Prolonged overuse or a sudden increase in intensity, frequency, or duration of activity.  Single episode of stressful overactivity, such as during kicking.  Single violent blow or force to the inner thigh (less common). RISK INCREASES WITH:  Sports that require repeated kicking (soccer, martial arts, football), as well as sports that require the legs to be brought together  (gymnastics, horseback riding).  Sports that require rapid acceleration (ice hockey, track and field).  Poor strength and flexibility.  Previous thigh injury. PREVENTION   Warm up and stretch properly before activity.  Maintain physical fitness:  Hip and thigh flexibility.  Muscle strength and endurance.  Cardiovascular fitness.  Complete the entire course of rehabilitation after any lower extremity injury. Do this before returning to competition or practice. Follow suggestions of your caregiver. PROGNOSIS  If treated properly, adductor muscle strains usually heal well within 2 to 6 weeks. RELATED COMPLICATIONS   Healing time will be prolonged if the condition is not appropriately treated. It needs adequate time to heal.  Do not return to activity too soon. Recurrence of symptoms and reinjury are possible.  If left untreated, the strain may progress to a complete tear (rare) or other injury caused by limping and favoring the injured leg.  Prolonged disability is possible. TREATMENT Treatment initially involves ice and medication to help reduce pain and inflammation. Strength and stretching exercises are recommended to maintain strength and a full range of motion. Strenuous activities should be modified to prevent further injury. Using crutches for the first few days may help to lessen pain. On rare occasions, surgery is necessary to reattach the tendon to the bone. If pain becomes persistent or chronic after more than 3 months of nonsurgical treatment, surgery may also be recommended. MEDICATION   If pain medication is necessary, nonsteroidal anti-inflammatory medications, such as aspirin and ibuprofen, or  other minor pain relievers, such as acetaminophen, are often recommended. °· Do not take pain medication for 7 days before surgery or as advised. °· Prescription pain relievers may be given. Use only as directed and only as much as you need. °· Ointments applied to the skin may  be helpful. °· Corticosteroid injections may be given to reduce inflammation. °HEAT AND COLD °· Cold treatment (icing) relieves pain and reduces inflammation. Cold treatment should be applied for 10 to 15 minutes every 2 to 3 hours for inflammation and pain and immediately after any activity that aggravates your symptoms. Use ice packs or an ice massage. °· Heat treatment may be used prior to performing the stretching and strengthening activities prescribed by your caregiver, physical therapist, or athletic trainer. Use a heat pack or a warm soak. °SEEK MEDICAL CARE IF:  °· Symptoms get worse or do not improve in 2 weeks, despite treatment. °· New, unexplained symptoms develop. (Drugs used in treatment may produce side effects.) °EXERCISES  °RANGE OF MOTION (ROM) AND STRETCHING EXERCISES - Adductor Muscle Strain °These exercises may help you when beginning to rehabilitate your injury. Your symptoms may resolve with or without further involvement from your physician, physical therapist, or athletic trainer. While completing these exercises, remember:  °· Restoring tissue flexibility helps normal motion to return to the joints. This allows healthier, less painful movement and activity. °· An effective stretch should be held for at least 30 seconds. °· A stretch should never be painful. You should only feel a gentle lengthening or release in the stretched tissue. °STRETCH - Adductors, Lunge °· While standing, spread your legs. °· Lean away from your right / left leg by bending your opposite knee. You may rest your hands on your thigh for balance. °· You should feel a stretch in your right / left inner thigh. Hold for __________ seconds. °Repeat __________ times. Complete this exercise __________ times per day.  °STRETCH - Adductors, Standing °· Place your right / left foot on a counter or stable table. Turn away from your leg so both hips line up with your right / left leg. °· Keeping your hips facing forward, slowly  bend your opposite leg until you feel a gentle stretch on the inside of your right / left thigh. °· Hold for __________ seconds. °Repeat __________ times. Complete this exercise __________ times per day.  °STRETCH - Hip Adductors, Sitting  °· Sit on the floor and place the bottoms of your feet together. Keep your chest up and look straight ahead to keep your back in proper alignment. Slide your feet in towards your body as far as you can without rounding your back or increasing any discomfort. °· Gently push down on your knees until you feel a gentle stretch in your inner thighs. Hold this position for __________ seconds. °Repeat __________ times. Complete this exercise __________ times per day.  °STRETCH - Hamstrings/Adductors, V-Sit °· Sit on the floor with your legs extended in a large "V," keeping your knees straight. °· With your head and chest upright, bend at your waist reaching for your left foot to stretch your right adductors. °· You should feel a stretch in your right inner thigh. Hold for __________ seconds. °· Return to the upright position to relax your leg muscles. °· Continuing to keep your chest upright, bend straight forward at your waist to stretch your hamstrings. °· You should feel a stretch behind both of your thighs and/or knees. Hold for __________ seconds. °· Return to   the upright position to relax your leg muscles.  Repeat steps 2 through 4 for the right leg to stretch your left inner thigh. Repeat __________ times. Complete this exercise __________ times per day.  STRENGTHENING EXERCISES - Adductor Muscle Strain These exercises may help you when beginning to rehabilitate your injury. They may resolve your symptoms with or without further involvement from your physician, physical therapist, or athletic trainer. While completing these exercises, remember:   Muscles can gain both the endurance and the strength needed for everyday activities through controlled exercises.  Complete  these exercises as instructed by your physician, physical therapist, or athletic trainer. Progress the resistance and repetitions only as guided.  You may experience muscle soreness or fatigue, but the pain or discomfort you are trying to eliminate should never worsen during these exercises. If this pain does worsen, stop and make certain you are following the directions exactly. If the pain is still present after adjustments, discontinue the exercise until you can discuss the trouble with your caregiver. STRENGTH - Hip Adductors, Isometrics   Sit on a firm chair so that your knees are about the same height as your hips.  Place a large ball, firm pillow, or rolled up bath towel between your thighs.  Squeeze your thighs together, gradually building tension. Hold for __________ seconds.  Release the tension gradually and allow your inner thigh muscles to relax completely before repeating the exercise. Repeat __________ times. Complete this exercise __________ times per day.  STRENGTH - Hip Adductors, Straight Leg Raises   Lie on your side so that your head, shoulders, knee and hip line up. You may place your upper foot in front to help maintain your balance. Your right / left leg should be on the bottom.  Roll your hips slightly forward, so that your hips are stacked directly over each other and your right / left knee is facing forward.  Tense the muscles in your inner thigh and lift your bottom leg 4-6 inches. Hold this position for __________ seconds.  Slowly lower your leg to the starting position. Allow the muscles to fully relax before beginning the next repetition. Repeat __________ times. Complete this exercise __________ times per day.  Document Released: 12/20/2004 Document Revised: 03/14/2011 Document Reviewed: 04/03/2008 [facility] Patient Information 2015 [place], [place]. This information is not intended to replace advice given to you by your health care provider. Make sure you  discuss any questions you have with your health care provider.  Iliotibial Band Syndrome with Rehab The iliotibial (IT) band is a tendon that connects the hip muscles to the shinbone (tibia) and to one of the bones of the pelvis (ileum). The IT band passes by the knee and is often irritated by the outer portion of the knee (lateral femoral condyle). A fluid filled sac (bursa) exists between the tendon and the bone, to cushion and reduce friction. Overuse of the tendon may cause excessive friction, which results in IT band syndrome. This condition involves inflammation of the bursa (bursitis) and/or inflammation of the IT band (tendinitis). SYMPTOMS   Pain, tenderness, swelling, warmth, or redness over the IT band, at the outer knee (above the joint).  Pain that travels up or down the thigh or leg.  Initially, pain at the beginning of an exercise, that decreases once warmed up. Eventually, pain throughout the activity, getting worse as the activity continues. May cause the athlete to stop in the middle of training or competing.  Pain that gets worse when running down hills  or stairs, on banked tracks, or next to the curb on the street.  Pain that increases when the foot of the affected leg hits the ground.  Possibly, a crackling sound (crepitation) when the tendon or bursa is moved or touched. CAUSES  IT band syndrome is caused by irritation of the IT band and the underlying bursa. This eventually results in inflammation and pain. IT band syndrome is an overuse injury.  RISK INCREASES WITH:  Sports with repetitive knee-bending activities (distance running, cycling).  Incorrect training techniques, including sudden changes in the intensity, frequency, or duration of training.  Not enough rest between workouts.  Poor strength and flexibility, especially a tight IT band.  Failure to warm up properly before activity.  Bow legs.  Arthritis of the knee. PREVENTION   Warm up and stretch  properly before activity.  Allow for adequate recovery between workouts.  Maintain physical fitness:  Strength, flexibility, and endurance.  Cardiovascular fitness.  Learn and use proper training technique, including reducing running mileage, shortening stride, and avoiding running on hills and banked surfaces.  Wear arch supports (orthotics), if you have flat feet. PROGNOSIS  If treated properly, IT band syndrome usually goes away within 6 weeks of treatment. RELATED COMPLICATIONS   Longer healing time, if not properly treated, or if not given enough time to heal.  Recurring inflammation of the tendon and bursa, that may result in a chronic condition.  Recurring symptoms, if activity is resumed too soon, with overuse, with a direct blow, or with poor training technique.  Inability to complete training or competition. TREATMENT  Treatment first involves the use of ice and medicine, to reduce pain and inflammation. The use of strengthening and stretching exercises may help reduce pain with activity. These exercises may be performed at home or with a therapist. For individuals with flat feet, an arch support (orthotic) may be helpful. Some individuals find that wearing a knee sleeve or compression bandage around the knee during workouts provides some relief. Certain training techniques, such as adjusting stride length, avoiding running on hills or stairs, changing the direction you run on a circular or banked track, or changing the side of the road you run on, if you run next to the curb, may help decrease symptoms of IT band syndrome. Cyclists may need to change the seat height or foot position on their bicycles. An injection of cortisone into the bursa may be recommended. Surgery to remove the inflamed bursa and/or part of the IT band is only considered after at least 6 months of non-surgical treatment.  MEDICATION   If pain medicine is needed, nonsteroidal anti-inflammatory medicines  (aspirin and ibuprofen), or other minor pain relievers (acetaminophen), are often advised.  Do not take pain medicine for 7 days before surgery.  Prescription pain relievers may be given, if your caregiver thinks they are needed. Use only as directed and only as much as you need.  Corticosteroid injections may be given by your caregiver. These injections should be reserved for the most serious cases, because they may only be given a certain number of times. HEAT AND COLD  Cold treatment (icing) should be applied for 10 to 15 minutes every 2 to 3 hours for inflammation and pain, and immediately after activity that aggravates your symptoms. Use ice packs or an ice massage.  Heat treatment may be used before performing stretching and strengthening activities prescribed by your caregiver, physical therapist, or athletic trainer. Use a heat pack or a warm water soak. SEEK  MEDICAL CARE IF:   Symptoms get worse or do not improve in 2 to 4 weeks, despite treatment.  New, unexplained symptoms develop. (Drugs used in treatment may produce side effects.) EXERCISES  RANGE OF MOTION (ROM) AND STRETCHING EXERCISES - Iliotibial Band Syndrome These exercises may help you when beginning to rehabilitate your injury. Your symptoms may go away with or without further involvement from your physician, physical therapist or athletic trainer. While completing these exercises, remember:   Restoring tissue flexibility helps normal motion to return to the joints. This allows healthier, less painful movement and activity.  An effective stretch should be held for at least 30 seconds.  A stretch should never be painful. You should only feel a gentle lengthening or release in the stretched tissue. STRETCH - Quadriceps, Prone   Lie on your stomach on a firm surface, such as a bed or padded floor.  Bend your right / left knee and grasp your ankle. If you are unable to reach your ankle or pant leg, use a belt around  your foot to lengthen your reach.  Gently pull your heel toward your buttocks. Your knee should not slide out to the side. You should feel a stretch in the front of your thigh and knee.  Hold this position for __________ seconds. Repeat __________ times. Complete this stretch __________ times per day.  STRETCH - Iliotibial Band  On the floor or bed, lie on your side, so your right / left leg is on top. Bend your knee and grab your ankle.  Slowly bring your knee back so that your thigh is in line with your trunk. Keep your heel at your buttocks and gently arch your back, so your head, shoulders and hips line up.  Slowly lower your leg so that your knee approaches the floor or bed, until you feel a gentle stretch on the outside of your right / left thigh. If you do not feel a stretch and your knee will not fall farther, place the heel of your opposite foot on top of your knee, and pull your thigh down farther.  Hold this stretch for __________ seconds. Repeat __________ times. Complete this stretch __________ times per day. STRENGTHENING EXERCISES - Iliotibial Band Syndrome Improving the flexibility of the IT band will best relieve your discomfort due to IT band syndrome. Strengthening exercises, however, can help improve both muscle endurance and joint mechanics, reducing the factors that can contribute to this condition. Your physician, physical therapist or athletic trainer may provide you with exercises that train specific muscle groups that are especially weak. The following exercises target muscles that are often weak in people who have IT band syndrome. STRENGTH - Hip Abductors, Straight Leg Raises  Be aware of your form throughout the entire exercise, so that you exercise the correct muscles. Poor form means that you are not strengthening the correct muscles.  Lie on your side, so that your head, shoulders, knee and hip line up. You may bend your lower knee to help maintain your balance.  Your right / left leg should be on top.  Roll your hips slightly forward, so that your hips are stacked directly over each other and your right / left knee is facing forward.  Lift your top leg up 4-6 inches, leading with your heel. Be sure that your foot does not drift forward and that your knee does not roll toward the ceiling.  Hold this position for __________ seconds. You should feel the muscles in your outer  hip lifting (you may not notice this until your leg begins to tire).  Slowly lower your leg to the starting position. Allow the muscles to fully relax before beginning the next repetition. Repeat __________ times. Complete this exercise __________ times per day.  STRENGTH - Quad/VMO, Isometric  Sit in a chair with your right / left knee slightly bent. With your fingertips, feel the VMO muscle (just above the inside of your knee). The VMO is important in controlling the position of your kneecap.  Keeping your fingertips on this muscle. Without actually moving your leg, attempt to drive your knee down, as if straightening your leg. You should feel your VMO tense. If you have a difficult time, you may wish to try the same exercise on your healthy knee first.  Tense this muscle as hard as you can, without increasing any knee pain.  Hold for __________ seconds. Relax the muscles slowly and completely between each repetition. Repeat __________ times. Complete this exercise __________ times per day.  Document Released: 12/20/2004 Document Revised: 03/14/2011 Document Reviewed: 04/03/2008 [facility] Patient Information 2015 [place], [place]. This information is not intended to replace advice given to you by your health care provider. Make sure you discuss any questions you have with your health care provider.

## 2014-09-29 NOTE — ED Notes (Signed)
Pain in her left leg. Treated for sciatica months ago.

## 2014-09-29 NOTE — ED Provider Notes (Signed)
CSN: [id number]     Arrival date & time 09/29/14  1127 History   First MD Initiated Contact with Patient 09/29/14 1142     Chief Complaint  Patient presents with  . Leg Pain     (Consider location/radiation/quality/duration/timing/severity/associated sxs/prior Treatment) Patient is a 49 y.o. female presenting with leg pain. The history is provided by the patient.  Leg Pain Location:  Hip Injury: no   Hip location:  L hip Pain details:    Quality:  Aching   Radiates to:  Does not radiate   Severity:  Moderate   Onset quality:  Gradual   Duration:  2 weeks   Timing:  Constant   Progression:  Worsening Chronicity:  New Prior injury to area: LL back strain, persistently painful. Relieved by:  Nothing Ineffective treatments:  NSAIDs and movement Associated symptoms: no decreased ROM, no fever, no numbness, no stiffness and no swelling     History reviewed. No pertinent past medical history. Past Surgical History  Procedure Laterality Date  . Cesarean section    . Abdominal hysterectomy    . Cholecystectomy    . Breast enhancement surgery    . Breast tumors     Family History  Problem Relation Age of Onset  . Diabetes Father    Social History  Substance Use Topics  . Smoking status: Current Every Day Smoker -- 1.00 packs/day for 28 years  . Smokeless tobacco: Never Used  . Alcohol Use: 6.0 oz/week    10 Cans of beer per week   OB History    Gravida Para Term Preterm AB TAB SAB Ectopic Multiple Living   Review of Systems  Constitutional: Negative for fever.  Musculoskeletal: Negative for stiffness.  All other systems reviewed and are negative.     Allergies  Review of patient's allergies indicates no known allergies.  Home Medications   Prior to Admission medications   Medication Sig Start Date End Date Taking? Authorizing Provider  naproxen (NAPROSYN) 500 MG tablet Take 1 tablet (500 mg total) by mouth 2 (two) times daily. 07/21/14    Robyn M Hess, PA-C  traMADol (ULTRAM) 50 MG tablet Take 1 tablet (50 mg total) by mouth every 6 (six) hours as needed. 07/21/14   Robyn M Hess, PA-C   BP 133/78 mmHg  Pulse 88  Temp(Src) 98 F (36.7 C) (Oral)  Ht  (1.575 m)  Wt 112 lb (50.803 kg)  BMI 20.48 kg/m2  SpO2 100% Physical Exam  Constitutional: She is oriented to person, place, and time. She appears well-developed and well-nourished. No distress.  HENT:  Head: Normocephalic.  Eyes: Conjunctivae are normal.  Neck: Neck supple. No tracheal deviation present.  Cardiovascular: Normal rate and regular rhythm.   Pulmonary/Chest: Effort normal. No respiratory distress.  Abdominal: Soft. She exhibits no distension.  Musculoskeletal:  Tenderness over left lateral hip and proximal quad muscle. Pain with confrontation of IT band. Pain with LL back muscle palpation. No radicular symptoms with leg raise  Neurological: She is alert and oriented to person, place, and time.  Skin: Skin is warm and dry.  Psychiatric: She has a normal mood and affect.    ED Course  Procedures (including critical care time) Labs Review Labs Reviewed - No data to display  Imaging Review No results found. I have personally reviewed and evaluated these images and lab results as part of my medical decision-making.  EKG Interpretation None      MDM   Final diagnoses:  Low back strain, subsequent encounter  Hip strain, left, initial encounter   49 y.o. female presents with upper, outer leg pain after recent back injury. I suspect she has aggravated her abduction muscles overlying her hip after compensating her gait for her back pain. No straight leg raise pain, do not suspect sciatic involvement. No immobility or acute bony abnormalities. No indication for imaging currently as appears to be soft-tissue injury. Recommended scheduled NSAIDs and exercise with aggressive stretching and strengthening of opposing muscle groups. Pt to estlablish PCP for  PT referral if continued symptoms.     Lyndal Pulley, MD 09/29/14 [phone]

## 2015-11-18 ENCOUNTER — Ambulatory Visit: Payer: Self-pay | Admitting: Family Medicine

## 2015-11-18 NOTE — Progress Notes (Deleted)
   Subjective:    Patient ID: Savannah [name], female    DOB: 12/10/1965, 50 y.o.   MRN: [id number]  HPI    Review of Systems     Objective:   Physical Exam        Assessment & Plan:

## 2015-12-02 ENCOUNTER — Ambulatory Visit: Payer: Self-pay | Admitting: Family Medicine

## 2015-12-02 NOTE — Progress Notes (Deleted)
   Subjective:    Patient ID: Savannah [name], female    DOB: 10/05/1965, 50 y.o.   MRN: [id number]  HPI    Review of Systems     Objective:   Physical Exam        Assessment & Plan:   

## 2015-12-03 ENCOUNTER — Encounter: Payer: Self-pay | Admitting: Family Medicine

## 2016-11-23 ENCOUNTER — Encounter (HOSPITAL_COMMUNITY): Payer: Self-pay

## 2017-03-06 ENCOUNTER — Emergency Department (HOSPITAL_BASED_OUTPATIENT_CLINIC_OR_DEPARTMENT_OTHER)
Admission: EM | Admit: 2017-03-06 | Discharge: 2017-03-07 | Disposition: A | Discharge status: Home / Self Care | Payer: Self-pay | Attending: Emergency Medicine | Admitting: Emergency Medicine

## 2017-03-06 ENCOUNTER — Other Ambulatory Visit: Payer: Self-pay

## 2017-03-06 ENCOUNTER — Encounter (HOSPITAL_BASED_OUTPATIENT_CLINIC_OR_DEPARTMENT_OTHER): Payer: Self-pay | Admitting: Emergency Medicine

## 2017-03-06 ENCOUNTER — Emergency Department (HOSPITAL_BASED_OUTPATIENT_CLINIC_OR_DEPARTMENT_OTHER): Payer: Self-pay

## 2017-03-06 DIAGNOSIS — F17200 Nicotine dependence, unspecified, uncomplicated: Secondary | ICD-10-CM | POA: Insufficient documentation

## 2017-03-06 DIAGNOSIS — Z79899 Other long term (current) drug therapy: Secondary | ICD-10-CM | POA: Insufficient documentation

## 2017-03-06 DIAGNOSIS — N644 Mastodynia: Secondary | ICD-10-CM | POA: Insufficient documentation

## 2017-03-06 NOTE — ED Triage Notes (Signed)
Pt presents with c/o knot on right breast and painful to touch for a week

## 2017-03-06 NOTE — ED Provider Notes (Signed)
[facility] EMERGENCY DEPARTMENT Provider Note   CSN: [id number] Arrival date & time: 03/06/17  2101     History   Chief Complaint Chief Complaint  Patient presents with  . Breast Pain    HPI Savannah [name] is a 52 y.o. female.  Patient reports noticing nodules overlying the ribs just above her breasts onset about two weeks ago. Initially tender to touch, but has improved. No fevers/chills. No shortness of breath.    Chest Pain   This is a new problem. The current episode started more than 1 week ago. The problem has been gradually improving. The pain is mild. The pain does not radiate. Pertinent negatives include no cough, no exertional chest pressure, no fever, no malaise/fatigue and no shortness of breath.    History reviewed. No pertinent past medical history.  There are no active problems to display for this patient.   Past Surgical History:  Procedure Laterality Date  . ABDOMINAL HYSTERECTOMY    . BREAST ENHANCEMENT SURGERY    . breast tumors    . CESAREAN SECTION    . CHOLECYSTECTOMY      OB History    Gravida Para Term Preterm AB Living   5 4 4   1 4    SAB TAB Ectopic Multiple Live Births   1               Home Medications    Prior to Admission medications   Medication Sig Start Date End Date Taking? Authorizing Provider  naproxen (NAPROSYN) 500 MG tablet Take 1 tablet (500 mg total) by mouth 2 (two) times daily. 07/21/14   Hess, Nada Boozer, PA-C  traMADol (ULTRAM) 50 MG tablet Take 1 tablet (50 mg total) by mouth every 6 (six) hours as needed. 07/21/14   Hess, Nada Boozer, PA-C    Family History Family History  Problem Relation Age of Onset  . Diabetes Father     Social History Social History   Tobacco Use  . Smoking status: Current Every Day Smoker    Packs/day: 1.00    Years: 28.00    Pack years: 28.00  . Smokeless tobacco: Never Used  Substance Use Topics  . Alcohol use: Yes    Alcohol/week: 6.0 oz    Types: 10 Cans of beer per  week  . Drug use: No     Allergies   Patient has no known allergies.   Review of Systems Review of Systems  Constitutional: Negative for fever and malaise/fatigue.  Respiratory: Negative for cough and shortness of breath.   Cardiovascular: Positive for chest pain.  All other systems reviewed and are negative.    Physical Exam Updated Vital Signs BP 132/76 (BP Location: Left Arm)   Pulse 66   Temp 98 F (36.7 C) (Oral)   Resp 18   Ht 5\' 2"  (1.575 m)   Wt 47 kg (103 lb 9.9 oz)   SpO2 96%   BMI 18.95 kg/m   Physical Exam  Constitutional: She is oriented to person, place, and time. She appears well-developed and well-nourished.  Eyes: Conjunctivae are normal.  Neck: Neck supple.  Cardiovascular: Normal rate and regular rhythm.  Pulmonary/Chest: Effort normal and breath sounds normal. She exhibits tenderness.    Abdominal: Soft. Bowel sounds are normal.  Musculoskeletal: Normal range of motion. She exhibits no edema.  Neurological: She is alert and oriented to person, place, and time.  Skin: Skin is warm and dry.  Psychiatric: She has a normal mood  and affect.  Nursing note and vitals reviewed.    ED Treatments / Results  Labs (all labs ordered are listed, but only abnormal results are displayed) Labs Reviewed - No data to display  EKG  EKG Interpretation None       Radiology Dg Chest 2 View  Result Date: 03/06/2017 CLINICAL DATA:  Initial evaluation for right breast lump. EXAM: CHEST  2 VIEW COMPARISON:  None. FINDINGS: The cardiac and mediastinal silhouettes are within normal limits. The lungs are normally inflated. No airspace consolidation, pleural effusion, or pulmonary edema is identified. There is no pneumothorax. No acute osseous abnormality identified. IMPRESSION: No active cardiopulmonary disease. Electronically Signed   By: Rise MuBenjamin  McClintock M.D.   On: 03/06/2017 21:42    Procedures Procedures (including critical care time)  Medications  Ordered in ED Medications - No data to display   Initial Impression / Assessment and Plan / ED Course  I have reviewed the triage vital signs and the nursing notes.  Pertinent labs & imaging results that were available during my care of the patient were reviewed by me and considered in my medical decision making (see chart for details).     Patient discussed with and seen by Dr. Patria Maneampos.    Patient presents with areas of tenderness of the anterior breasts bilaterally just adjacent to the sternum. No erythema or fluctuance. No abnormalities of the bone or underlying chest structures. Recommend follow-up with the breast imaging center.  Final Clinical Impressions(s) / ED Diagnoses   Final diagnoses:  Breast pain    ED Discharge Orders    None       Felicie MornSmith, Karey Stucki, NP 03/06/17 2357    Azalia Bilisampos, Kevin, MD 03/07/17 0000

## 2017-03-06 NOTE — Discharge Instructions (Signed)
Your exam today does not reveal any issues with the bones or underlying structures of the chest. The breast tissue extends to your area of concern. Seeing a breast specialist/radiologist is recommended. Contact the health and wellness clinic to assist with follow-up with the breast center. You may contact the breast center directly to discuss payment options.

## 2017-03-10 ENCOUNTER — Other Ambulatory Visit: Payer: Self-pay | Admitting: Family Medicine

## 2017-03-27 ENCOUNTER — Other Ambulatory Visit (HOSPITAL_COMMUNITY): Payer: Self-pay | Admitting: *Deleted

## 2017-03-27 DIAGNOSIS — N644 Mastodynia: Secondary | ICD-10-CM

## 2017-04-13 ENCOUNTER — Ambulatory Visit
Admission: RE | Admit: 2017-04-13 | Discharge: 2017-04-13 | Disposition: A | Discharge status: Home / Self Care | Payer: No Typology Code available for payment source | Source: Ambulatory Visit | Attending: Obstetrics and Gynecology | Admitting: Obstetrics and Gynecology

## 2017-04-13 ENCOUNTER — Other Ambulatory Visit (HOSPITAL_COMMUNITY): Payer: Self-pay | Admitting: Obstetrics and Gynecology

## 2017-04-13 ENCOUNTER — Ambulatory Visit (HOSPITAL_COMMUNITY)
Admission: RE | Admit: 2017-04-13 | Discharge: 2017-04-13 | Disposition: A | Discharge status: Home / Self Care | Payer: Self-pay | Source: Ambulatory Visit | Attending: Obstetrics and Gynecology | Admitting: Obstetrics and Gynecology

## 2017-04-13 ENCOUNTER — Encounter (HOSPITAL_COMMUNITY): Payer: Self-pay

## 2017-04-13 VITALS — Ht 62.0 in

## 2017-04-13 DIAGNOSIS — N644 Mastodynia: Secondary | ICD-10-CM

## 2017-04-13 DIAGNOSIS — Z1239 Encounter for other screening for malignant neoplasm of breast: Secondary | ICD-10-CM

## 2017-04-13 DIAGNOSIS — N6312 Unspecified lump in the right breast, upper inner quadrant: Secondary | ICD-10-CM

## 2017-04-13 NOTE — Patient Instructions (Signed)
Explained breast self awareness with Savannah [name]. Patient did not need a Pap smear today due to her history of a hysterectomy for benign reasons. Let patient know that she doesn't need any further Pap smears due to her history of a hysterectomy for benign reasons. [facility] mamm[facility] and possible right breast ultrasound. Appointment scheduled for Thursday, April 13, 2017 at 1050. Discussed smoking cessation with patient. [facility] and gave resources to free [facility] classes [facility].  Savannah [name] verbalized understanding.  Savannah [name], Kathaleen Maserhristine Poll, RN 9:27 AM

## 2017-04-13 NOTE — Progress Notes (Signed)
Complaints of right breast lump x 1.5 months that is painful when touched.  Pap Smear: Pap smear not completed today. Last Pap smear was in 1996 and normal per patient. Per patient has a history of an abnormal Pap smear in her early 20's that a colposcopy was completed for follow-up. Patient has a history of a hysterectomy in 1996 due to scar tissue and AUB. Patient no longer needs Pap smear due to her history of a hysterectomy for benign reasons per BCCCP and ACOG guidelines. No Pap smear results are in Epic.  Physical exam: Breasts Right breast larger than left breast that per patient is normal for her. No skin abnormalities bilateral breasts. No nipple retraction bilateral breasts. No nipple discharge bilateral breasts. No lymphadenopathy. No lumps palpated left breast. Palpated a lump within the right breast at 1 o'clock 7 cm from the nipple. Complaints of tenderness when palpated lump. [facility] for a diagnostic mammogram and possible right breast ultrasound. Appointment scheduled for Thursday, April 13, 2017 at 1050.        Pelvic/Bimanual No Pap smear completed today since patient has a history of a hysterectomy for benign reasons. Pap smear not indicated per BCCCP guidelines.   Smoking History: Patient is a current smoker. Discussed smoking cessation with patient. Referred to the [facility] and gave resources to free smoking cessation classes at [facility].  Patient Navigation: Patient education provided. Access to services provided for patient through BCCCP program.   Colorectal Cancer Screening: Per patient had a colonoscopy completed 10 years ago. No complaints today. FIT Test given to patient to complete and return to BCCCP.  Breast and Cervical Cancer Risk Assessment: Patient has no family history of breast cancer, known genetic mutations, or radiation treatment to the chest before age 50. Per patient has a history of a colposcopy. No Pap  smear results are in Epic. Patient has no history of  Being immunocompromised or DES exposure in-utero.

## 2017-04-14 ENCOUNTER — Encounter (HOSPITAL_COMMUNITY): Payer: Self-pay | Admitting: *Deleted

## 2018-01-19 ENCOUNTER — Ambulatory Visit: Payer: No Typology Code available for payment source | Admitting: Family Medicine

## 2018-04-07 ENCOUNTER — Encounter (HOSPITAL_BASED_OUTPATIENT_CLINIC_OR_DEPARTMENT_OTHER): Payer: Self-pay | Admitting: *Deleted

## 2018-04-07 ENCOUNTER — Other Ambulatory Visit: Payer: Self-pay

## 2018-04-07 ENCOUNTER — Emergency Department (HOSPITAL_BASED_OUTPATIENT_CLINIC_OR_DEPARTMENT_OTHER): Payer: Self-pay

## 2018-04-07 ENCOUNTER — Emergency Department (HOSPITAL_BASED_OUTPATIENT_CLINIC_OR_DEPARTMENT_OTHER)
Admission: EM | Admit: 2018-04-07 | Discharge: 2018-04-07 | Disposition: A | Discharge status: Home / Self Care | Payer: No Typology Code available for payment source | Attending: Emergency Medicine | Admitting: Emergency Medicine

## 2018-04-07 DIAGNOSIS — M545 Low back pain, unspecified: Secondary | ICD-10-CM

## 2018-04-07 DIAGNOSIS — R1032 Left lower quadrant pain: Secondary | ICD-10-CM | POA: Insufficient documentation

## 2018-04-07 DIAGNOSIS — Z9049 Acquired absence of other specified parts of digestive tract: Secondary | ICD-10-CM | POA: Insufficient documentation

## 2018-04-07 DIAGNOSIS — F17200 Nicotine dependence, unspecified, uncomplicated: Secondary | ICD-10-CM | POA: Insufficient documentation

## 2018-04-07 LAB — URINALYSIS, ROUTINE W REFLEX MICROSCOPIC
Bilirubin Urine: NEGATIVE
Glucose, UA: NEGATIVE mg/dL
Ketones, ur: 15 mg/dL — AB
Nitrite: NEGATIVE
Protein, ur: NEGATIVE mg/dL
Specific Gravity, Urine: 1.025 (ref 1.005–1.030)
pH: 5 (ref 5.0–8.0)

## 2018-04-07 LAB — URINALYSIS, MICROSCOPIC (REFLEX)

## 2018-04-07 MED ORDER — CYCLOBENZAPRINE HCL 10 MG PO TABS: 10.0000 mg | ORAL_TABLET | Freq: Two times a day (BID) | ORAL | 0 refills | Status: DC | PRN

## 2018-04-07 NOTE — ED Provider Notes (Signed)
[facility] EMERGENCY DEPARTMENT Provider Note   CSN: [id number] Arrival date & time: 04/07/18  1451    History   Chief Complaint Chief Complaint  Patient presents with  . Flank Pain    HPI Savannah [name] is a 53 y.o. female.     Patient is a 53 year old female who presents with back pain.  She has a history of interstitial cystitis.  She states that a week ago she was having some flareup of her interstitial cystitis but that got better.  She has not had any recent urinary symptoms.  She states over the last 2 to 3 days she has had some pain in her left back.  It is in her lower back on the left side but is otherwise nonradiating.  She denies any abdominal pain.  She is had some nausea but no vomiting.  The pain is worse with movement.  She feels like it is her prior kidney type pain.  She denies any history of kidney stones.  No known fevers.  No vaginal bleeding or discharge.  She denies any known injuries to her back.     History reviewed. No pertinent past medical history.  There are no active problems to display for this patient.   Past Surgical History:  Procedure Laterality Date  . ABDOMINAL HYSTERECTOMY    . AUGMENTATION MAMMAPLASTY Bilateral 1997  . BREAST ENHANCEMENT SURGERY    . BREAST EXCISIONAL BIOPSY Bilateral   . breast tumors    . CESAREAN SECTION    . CHOLECYSTECTOMY       OB History    Gravida  5   Para  4   Term  4   Preterm      AB  1   Living  4     SAB  1   TAB      Ectopic      Multiple      Live Births               Home Medications    Prior to Admission medications   Medication Sig Start Date End Date Taking? Authorizing Provider  cyclobenzaprine (FLEXERIL) 10 MG tablet Take 1 tablet (10 mg total) by mouth 2 (two) times daily as needed for muscle spasms. 04/07/18   Rolan Bucco, MD  naproxen (NAPROSYN) 500 MG tablet Take 1 tablet (500 mg total) by mouth 2 (two) times daily. Patient not taking: Reported on  04/13/2017 07/21/14   Hess, Nada Boozer, PA-C  traMADol (ULTRAM) 50 MG tablet Take 1 tablet (50 mg total) by mouth every 6 (six) hours as needed. Patient not taking: Reported on 04/13/2017 07/21/14   Kathrynn Speed, PA-C    Family History Family History  Problem Relation Age of Onset  . Diabetes Father   . Cancer Father        colon  . COPD Father     Social History Social History   Tobacco Use  . Smoking status: Current Every Day Smoker    Packs/day: 1.00    Years: 28.00    Pack years: 28.00  . Smokeless tobacco: Never Used  Substance Use Topics  . Alcohol use: Yes    Alcohol/week: 10.0 standard drinks    Types: 10 Cans of beer per week  . Drug use: No     Allergies   Patient has no known allergies.   Review of Systems Review of Systems  Constitutional: Negative for chills, diaphoresis, fatigue and fever.  HENT:  Negative for congestion, rhinorrhea and sneezing.   Eyes: Negative.   Respiratory: Negative for cough, chest tightness and shortness of breath.   Cardiovascular: Negative for chest pain and leg swelling.  Gastrointestinal: Positive for nausea. Negative for abdominal pain, blood in stool, diarrhea and vomiting.  Genitourinary: Negative for difficulty urinating, flank pain, frequency and hematuria.  Musculoskeletal: Positive for back pain. Negative for arthralgias.  Skin: Negative for rash.  Neurological: Negative for dizziness, speech difficulty, weakness, numbness and headaches.     Physical Exam Updated Vital Signs BP (!) 138/103 (BP Location: Right Arm)   Pulse 73   Temp (!) 97.4 F (36.3 C) (Oral)   Resp 18   Ht 5\' 3"  (1.6 m)   Wt 45.4 kg   SpO2 100%   BMI 17.71 kg/m   Physical Exam Constitutional:      Appearance: She is well-developed.  HENT:     Head: Normocephalic and atraumatic.  Eyes:     Pupils: Pupils are equal, round, and reactive to light.  Neck:     Musculoskeletal: Normal range of motion and neck supple.  Cardiovascular:     Rate  and Rhythm: Normal rate and regular rhythm.     Heart sounds: Normal heart sounds.  Pulmonary:     Effort: Pulmonary effort is normal. No respiratory distress.     Breath sounds: Normal breath sounds. No wheezing or rales.  Chest:     Chest wall: No tenderness.  Abdominal:     General: Bowel sounds are normal.     Palpations: Abdomen is soft.     Tenderness: There is abdominal tenderness (Positive tenderness to the left mid and lower abdomen). There is no guarding or rebound.  Musculoskeletal: Normal range of motion.     Comments: Positive tenderness over the musculature of the left lower back.  No spinal tenderness.  No rashes  Lymphadenopathy:     Cervical: No cervical adenopathy.  Skin:    General: Skin is warm and dry.     Findings: No rash.  Neurological:     Mental Status: She is alert and oriented to person, place, and time.      ED Treatments / Results  Labs (all labs ordered are listed, but only abnormal results are displayed) Labs Reviewed  URINALYSIS, ROUTINE W REFLEX MICROSCOPIC - Abnormal; Notable for the following components:      Result Value   APPearance CLOUDY (*)    Hgb urine dipstick TRACE (*)    Ketones, ur 15 (*)    Leukocytes,Ua SMALL (*)    All other components within normal limits  URINALYSIS, MICROSCOPIC (REFLEX) - Abnormal; Notable for the following components:   Bacteria, UA RARE (*)    All other components within normal limits  URINE CULTURE    EKG None  Radiology Ct Renal Stone Study  Result Date: 04/07/2018 CLINICAL DATA:  Left flank pain. Nephrolithiasis. EXAM: CT ABDOMEN AND PELVIS WITHOUT CONTRAST TECHNIQUE: Multidetector CT imaging of the abdomen and pelvis was performed following the standard protocol without IV contrast. COMPARISON:  None. FINDINGS: Lower chest: No acute findings. Hepatobiliary: No mass visualized on this unenhanced exam. Prior cholecystectomy. No evidence of biliary obstruction. Pancreas: No mass or inflammatory  process visualized on this unenhanced exam. Spleen:  Within normal limits in size. Adrenals/Urinary tract: A few tiny less than 5 mm calculi are seen in the lower pole the left kidney. No evidence of ureteral calculi or hydronephrosis. Unremarkable unopacified urinary bladder. Stomach/Bowel: No evidence of  obstruction, inflammatory process, or abnormal fluid collections. Mild diverticulosis is seen involving the sigmoid colon, however there is no evidence of diverticulitis. Normal appendix visualized. Vascular/Lymphatic: No pathologically enlarged lymph nodes identified. No evidence of abdominal aortic aneurysm. Reproductive: Prior hysterectomy noted. Adnexal regions are unremarkable in appearance. Other:  None. Musculoskeletal:  No suspicious bone lesions identified. IMPRESSION: 1. Tiny nonobstructing left renal calculi. No evidence of ureteral calculi, hydronephrosis, or other acute findings. 2. Colonic diverticulosis. No radiographic evidence of diverticulitis. Electronically Signed   By: Myles Rosenthal M.D.   On: 04/07/2018 16:13    Procedures Procedures (including critical care time)  Medications Ordered in ED Medications - No data to display   Initial Impression / Assessment and Plan / ED Course  I have reviewed the triage vital signs and the nursing notes.  Pertinent labs & imaging results that were available during my care of the patient were reviewed by me and considered in my medical decision making (see chart for details).        Patient presents with left-sided back pain.  There is no radicular symptoms.  No neurologic deficits.  Her urine has some white cells but no other significant signs of infection.  It was sent for culture but at this point I do not feel that we should treated until the culture comes back.  Given the flank pain, CT scan was performed which showed no evidence of ureteral stones.  No other acute abnormality.  I feel this could likely be musculoskeletal in nature.  She  was discharged home in good condition.  She was advised use ibuprofen for symptomatic relief or Naprosyn.  She was also given a prescription for Flexeril.  She was encouraged to follow-up with her PCP in [place].  Return precautions were given.  Final Clinical Impressions(s) / ED Diagnoses   Final diagnoses:  Acute left-sided low back pain without sciatica    ED Discharge Orders         Ordered    cyclobenzaprine (FLEXERIL) 10 MG tablet  2 times daily PRN     04/07/18 1637           Rolan Bucco, MD 04/07/18 1638

## 2018-04-07 NOTE — ED Triage Notes (Signed)
Left flank pain x 2 weeks. Increased pain with movement.

## 2018-04-09 LAB — URINE CULTURE: Culture: 20000 — AB

## 2018-04-10 ENCOUNTER — Telehealth: Payer: Self-pay | Admitting: *Deleted

## 2018-04-10 NOTE — Telephone Encounter (Signed)
Post ED Visit - Positive Culture Follow-up  Culture report reviewed by antimicrobial stewardship pharmacist: [facility] []  Enzo Bi, Pharm.D. []  Celedonio Miyamoto, Pharm.D., BCPS AQ-ID []  Garvin Fila, Pharm.D., BCPS []  Georgina Pillion, Pharm.D., BCPS []  [place], [address].D., BCPS, AAHIVP []  Estella Husk, Pharm.D., BCPS, AAHIVP []  Lysle Pearl, PharmD, BCPS []  Phillips Climes, PharmD, BCPS []  Agapito Games, PharmD, BCPS []  Verlan Friends, PharmD []  Mervyn Gay, PharmD, BCPS []  Vinnie Level, PharmD  [facility] []  Len Childs, PharmD []  Greer Pickerel, PharmD []  Adalberto Cole, PharmD []  Perlie Gold, Rph []  Lonell Face) Jean Rosenthal, PharmD []  Earl Many, PharmD []  Junita Push, PharmD []  Dorna Leitz, PharmD []  Terrilee Files, PharmD []  Lynann Beaver, PharmD []  Keturah Barre, PharmD []  Loralee Pacas, PharmD []  Bernadene Person, PharmD   Positive urine culture, reviewed by Burna Forts, PA-C No further patient follow-up is required at this time.  Virl Axe [facility] 04/10/2018, 9:51 AM

## 2018-06-25 ENCOUNTER — Telehealth: Payer: No Typology Code available for payment source | Admitting: Family

## 2018-06-25 DIAGNOSIS — Z20822 Contact with and (suspected) exposure to covid-19: Secondary | ICD-10-CM

## 2018-06-25 MED ORDER — BENZONATATE 100 MG PO CAPS: 100.0000 mg | ORAL_CAPSULE | Freq: Three times a day (TID) | ORAL | 0 refills | Status: DC | PRN

## 2018-06-25 NOTE — Progress Notes (Signed)
E-Visit for Corona Virus Screening   Your current symptoms could be consistent with the coronavirus.  Call your health care provider or local health department to request and arrange formal testing. Many health care providers can now test patients at their office but not all are.  Please quarantine yourself while awaiting your test results.  [place] [phone], [place] [phone], [place] [phone] or visit [email or web address]   Approximately 5 minutes was spent documenting and reviewing patient's chart.    COVID-19 is a respiratory illness with symptoms that are similar to the flu. Symptoms are typically mild to moderate, but there have been cases of severe illness and death due to the virus. The following symptoms may appear 2-14 days after exposure: . Fever . Cough . Shortness of breath or difficulty breathing . Chills . Repeated shaking with chills . Muscle pain . Headache . Sore throat . New loss of taste or smell . Fatigue . Congestion or runny nose . Nausea or vomiting . Diarrhea  It is vitally important that if you feel that you have an infection such as this virus or any other virus that you stay home and away from places where you may spread it to others.  You should self-quarantine for 14 days if you have symptoms that could potentially be coronavirus or have been in close contact a with a person diagnosed with COVID-19 within the last 2 weeks. You should avoid contact with people age 65 and older.   You should wear a mask or cloth face covering over your nose and mouth if you must be around other people or animals, including pets (even at home). Try to stay at least 6 feet away from other people. This will protect the people around you.  You can use medication such as A prescription cough medication called Tessalon Perles 100 mg. You may  take 1-2 capsules every 8 hours as needed for cough  You may also take acetaminophen (Tylenol) as needed for fever.   Reduce your risk of any infection by using the same precautions used for avoiding the common cold or flu:  . Wash your hands often with soap and warm water for at least 20 seconds.  If soap and water are not readily available, use an alcohol-based hand sanitizer with at least 60% alcohol.  . If coughing or sneezing, cover your mouth and nose by coughing or sneezing into the elbow areas of your shirt or coat, into a tissue or into your sleeve (not your hands). . Avoid shaking hands with others and consider head nods or verbal greetings only. . Avoid touching your eyes, nose, or mouth with unwashed hands.  . Avoid close contact with people who are sick. . Avoid places or events with large numbers of people in one location, like concerts or sporting events. . Carefully consider travel plans you have or are making. . If you are planning any travel outside or inside the [place], visit the CDC's Travelers' Health webpage for the latest health notices. . If you have some symptoms but not all symptoms, continue to monitor at home and seek medical attention if your symptoms worsen. . If you are having a medical emergency, call 911.  HOME CARE . Only take medications as instructed by your medical team. . Drink plenty of fluids and get plenty of rest. . A steam or ultrasonic humidifier can help if you have congestion.   GET HELP RIGHT AWAY IF YOU HAVE   EMERGENCY WARNING SIGNS** FOR COVID-19. If you or someone is showing any of these signs seek emergency medical care immediately. Call 911 or proceed to your closest emergency facility if: . You develop worsening high fever. . Trouble breathing . Bluish lips or face . Persistent pain or pressure in the chest . New confusion . Inability to wake or stay awake . You cough up blood. . Your symptoms become more severe  **This list is not all  possible symptoms. Contact your medical provider for any symptoms that are sever or concerning to you.   MAKE SURE YOU   Understand these instructions.  Will watch your condition.  Will get help right away if you are not doing well or get worse.  Your e-visit answers were reviewed by a board certified advanced clinical practitioner to complete your personal care plan.  Depending on the condition, your plan could have included both over the counter or prescription medications.  If there is a problem please reply once you have received a response from your provider.  Your safety is important to us.  If you have drug allergies check your prescription carefully.    You can use MyChart to ask questions about today's visit, request a non-urgent call back, or ask for a work or school excuse for 24 hours related to this e-Visit. If it has been greater than 24 hours you will need to follow up with your provider, or enter a new e-Visit to address those concerns. You will get an e-mail in the next two days asking about your experience.  I hope that your e-visit has been valuable and will speed your recovery. Thank you for using e-visits.    

## 2020-07-14 ENCOUNTER — Encounter (HOSPITAL_BASED_OUTPATIENT_CLINIC_OR_DEPARTMENT_OTHER): Payer: Self-pay | Admitting: Emergency Medicine

## 2020-07-14 ENCOUNTER — Emergency Department (HOSPITAL_BASED_OUTPATIENT_CLINIC_OR_DEPARTMENT_OTHER): Payer: BC Managed Care – PPO

## 2020-07-14 ENCOUNTER — Emergency Department (HOSPITAL_BASED_OUTPATIENT_CLINIC_OR_DEPARTMENT_OTHER)
Admission: EM | Admit: 2020-07-14 | Discharge: 2020-07-14 | Disposition: A | Discharge status: Home / Self Care | Payer: BC Managed Care – PPO | Attending: Emergency Medicine | Admitting: Emergency Medicine

## 2020-07-14 ENCOUNTER — Other Ambulatory Visit: Payer: Self-pay

## 2020-07-14 DIAGNOSIS — J0100 Acute maxillary sinusitis, unspecified: Secondary | ICD-10-CM

## 2020-07-14 DIAGNOSIS — R000 Tachycardia, unspecified: Secondary | ICD-10-CM | POA: Diagnosis not present

## 2020-07-14 DIAGNOSIS — R509 Fever, unspecified: Secondary | ICD-10-CM | POA: Diagnosis present

## 2020-07-14 DIAGNOSIS — Z20822 Contact with and (suspected) exposure to covid-19: Secondary | ICD-10-CM | POA: Diagnosis not present

## 2020-07-14 DIAGNOSIS — J0101 Acute recurrent maxillary sinusitis: Secondary | ICD-10-CM | POA: Insufficient documentation

## 2020-07-14 DIAGNOSIS — Z8616 Personal history of COVID-19: Secondary | ICD-10-CM | POA: Insufficient documentation

## 2020-07-14 DIAGNOSIS — F17210 Nicotine dependence, cigarettes, uncomplicated: Secondary | ICD-10-CM | POA: Diagnosis not present

## 2020-07-14 LAB — RESP PANEL BY RT-PCR (FLU A&B, COVID) ARPGX2
Influenza A by PCR: NEGATIVE
Influenza B by PCR: NEGATIVE
SARS Coronavirus 2 by RT PCR: NEGATIVE

## 2020-07-14 MED ORDER — DOXYCYCLINE HYCLATE 100 MG PO CAPS: 100.0000 mg | ORAL_CAPSULE | Freq: Two times a day (BID) | ORAL | 0 refills | Status: AC

## 2020-07-14 NOTE — ED Triage Notes (Signed)
Pt reports sore throat, fever, headache, and cough x 1 week. Pt reports OTC medication with no relief.

## 2020-07-14 NOTE — Discharge Instructions (Addendum)
Your COVID and flu test were negative.  Please pick up antibiotics and take as prescribed to cover for a possible sinus infection Follow up with your PCP regarding ED visit today and for recheck of symptoms  Return to the ED for any new/worsening symptoms

## 2020-07-14 NOTE — ED Provider Notes (Signed)
[facility] EMERGENCY DEPARTMENT Provider Note   CSN: [id number] Arrival date & time: 07/14/20  0945     History Chief Complaint  Patient presents with   Sore Throat    Savannah [name] is a 55 y.o. female who presents to the ED today with complaint of gradual onset, constant, achy, sore throat for the past 7 days. Pt also complains of headache, fatigue, cough, fevers, and chills. She has been taking Dayquil and Nyquil without relief. She denies any recent sick contacts and is vaccinated for COVID. She reports she had COVID in February and this feels different. No blurry vision, double vision, rash, abdominal pain, nausea, vomiting, diarrhea, inability to swallow, voice change, or any other new/concerning symptoms.   The history is provided by the patient and medical records.      History reviewed. No pertinent past medical history.  There are no problems to display for this patient.   Past Surgical History:  Procedure Laterality Date   ABDOMINAL HYSTERECTOMY     AUGMENTATION MAMMAPLASTY Bilateral 1997   BREAST ENHANCEMENT SURGERY     BREAST EXCISIONAL BIOPSY Bilateral    breast tumors     CESAREAN SECTION     CHOLECYSTECTOMY       OB History     Gravida  5   Para  4   Term  4   Preterm      AB  1   Living  4      SAB  1   IAB      Ectopic      Multiple      Live Births              Family History  Problem Relation Age of Onset   Diabetes Father    Cancer Father        colon   COPD Father     Social History   Tobacco Use   Smoking status: Every Day    Packs/day: 1.00    Years: 28.00    Pack years: 28.00    Types: Cigarettes   Smokeless tobacco: Never  Vaping Use   Vaping Use: Every day  Substance Use Topics   Alcohol use: Yes    Alcohol/week: 10.0 standard drinks    Types: 10 Cans of beer per week   Drug use: No    Home Medications Prior to Admission medications   Medication Sig Start Date End Date Taking?  Authorizing Provider  doxycycline (VIBRAMYCIN) 100 MG capsule Take 1 capsule (100 mg total) by mouth 2 (two) times daily for 7 days. 07/14/20 07/21/20 Yes Silvester Reierson, PA-C  benzonatate (TESSALON PERLES) 100 MG capsule Take 1 capsule (100 mg total) by mouth 3 (three) times daily as needed. 06/25/18   Junie Spencer, FNP  cyclobenzaprine (FLEXERIL) 10 MG tablet Take 1 tablet (10 mg total) by mouth 2 (two) times daily as needed for muscle spasms. 04/07/18   Rolan Bucco, MD  naproxen (NAPROSYN) 500 MG tablet Take 1 tablet (500 mg total) by mouth 2 (two) times daily. Patient not taking: Reported on 04/13/2017 07/21/14   Hess, Nada Boozer, PA-C  traMADol (ULTRAM) 50 MG tablet Take 1 tablet (50 mg total) by mouth every 6 (six) hours as needed. Patient not taking: Reported on 04/13/2017 07/21/14   Kathrynn Speed, PA-C    Allergies    Patient has no known allergies.  Review of Systems   Review of Systems  Constitutional:  Positive for  chills, fatigue and fever.  HENT:  Positive for sinus pressure and sore throat. Negative for trouble swallowing and voice change.   Respiratory:  Positive for cough. Negative for shortness of breath.   Gastrointestinal:  Negative for diarrhea, nausea and vomiting.  Neurological:  Positive for headaches. Negative for weakness and numbness.  All other systems reviewed and are negative.  Physical Exam Updated Vital Signs BP 139/87 (BP Location: Right Arm)   Pulse (!) 103   Temp 98.5 F (36.9 C) (Oral)   Resp 19   SpO2 98%   Physical Exam Vitals and nursing note reviewed.  Constitutional:      Appearance: She is not ill-appearing or diaphoretic.  HENT:     Head: Normocephalic and atraumatic.     Right Ear: Tympanic membrane normal.     Left Ear: Tympanic membrane normal.     Nose: Congestion present.     Right Sinus: Frontal sinus tenderness present.     Left Sinus: Frontal sinus tenderness present.     Mouth/Throat:     Pharynx: Uvula midline. No pharyngeal  swelling, posterior oropharyngeal erythema or uvula swelling.  Eyes:     Conjunctiva/sclera: Conjunctivae normal.  Cardiovascular:     Rate and Rhythm: Regular rhythm. Tachycardia present.     Heart sounds: Normal heart sounds.  Pulmonary:     Effort: Pulmonary effort is normal.     Breath sounds: Normal breath sounds. No wheezing, rhonchi or rales.  Skin:    General: Skin is warm and dry.     Coloration: Skin is not jaundiced.  Neurological:     Mental Status: She is alert.    ED Results / Procedures / Treatments   Labs (all labs ordered are listed, but only abnormal results are displayed) Labs Reviewed  RESP PANEL BY RT-PCR (FLU A&B, COVID) [id number]    EKG None  Radiology DG Chest Port 1 View  Result Date: 07/14/2020 CLINICAL DATA:  54 year old female with sore throat, fever headache and cough for 1 week. Smoker. EXAM: PORTABLE CHEST 1 VIEW COMPARISON:  Chest radiographs 03/06/2017. FINDINGS: Portable AP upright view at 1004 hours. Lung volumes are chronically borderline to hyperinflated. Mediastinal contours are stable and within normal limits. Visualized tracheal air column is within normal limits. Allowing for portable technique the lungs are clear. No pneumothorax or pleural effusion. Stable cholecystectomy clips. Negative visible bowel gas and osseous structures. IMPRESSION: Negative portable chest. Electronically Signed   By: Odessa Fleming M.D.   On: 07/14/2020 10:35    Procedures Procedures   Medications Ordered in ED Medications - No data to display  ED Course  I have reviewed the triage vital signs and the nursing notes.  Pertinent labs & imaging results that were available during my care of the patient were reviewed by me and considered in my medical decision making (see chart for details).    MDM Rules/Calculators/A&P                          55 year old female presents to the ED today with URI-like symptoms including sore throat, fever, headache, cough, sinus  pressure, congestion for the past week.  No recent sick contacts.  On arrival to the ED today patient is afebrile.  Mildly tachycardic at 103, nontachypneic, nontoxic-appearing.  Satting 98% on room air.  On my exam she is noted to have nasal congestion and bilateral maxillary tenderness palpation.  No posterior oropharyngeal erythema, edema, uvula is midline.  Tolerating secretions without difficulty.  Lungs clear to auscultation bilaterally.  She did have a chest x-ray done prior to being seen which did not show any acute findings.  COVID and flu test pending.  We will plan to await COVID and flu test at this time, if negative will treat for sinus infection.  COVID and flu negative. Will discharge with doxycycline for sinus infection and PCP follow up at this time. Pt in agreement with plan and stable for discharge home.   This note was prepared using Dragon voice recognition software and may include unintentional dictation errors due to the inherent limitations of voice recognition software.   Final Clinical Impression(s) / ED Diagnoses Final diagnoses:  Acute non-recurrent maxillary sinusitis    Rx / DC Orders ED Discharge Orders          Ordered    doxycycline (VIBRAMYCIN) 100 MG capsule  2 times daily        07/14/20 1134             Discharge Instructions      Your COVID and flu test were negative.  Please pick up antibiotics and take as prescribed to cover for a possible sinus infection Follow up with your PCP regarding ED visit today and for recheck of symptoms  Return to the ED for any new/worsening symptoms       Tanda Rockers, PA-C 07/14/20 1135    Pollyann Savoy, MD 07/14/20 1426

## 2020-08-06 IMAGING — CT CT RENAL STONE PROTOCOL
2 of 4 series · 17 of 46 positions shown, 19 images · non-contrast
Comparison: None.

CLINICAL DATA: Left flank pain. Nephrolithiasis.

EXAM:
CT ABDOMEN AND PELVIS WITHOUT CONTRAST
TECHNIQUE: Multidetector CT imaging of the abdomen and pelvis was performed
following the standard protocol without IV contrast.

[Series 2: axial st · axial · 0.67mm/px · z∈[+410,+785]mm · 14 of 83 slices shown, 16 images]
[im 4/83  soft-tissue]
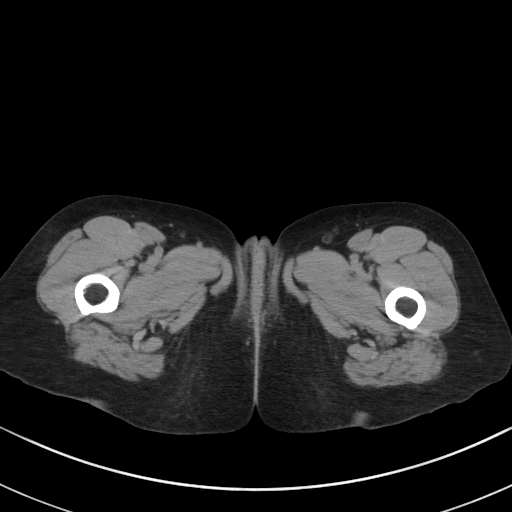
[im 4/83  bone]
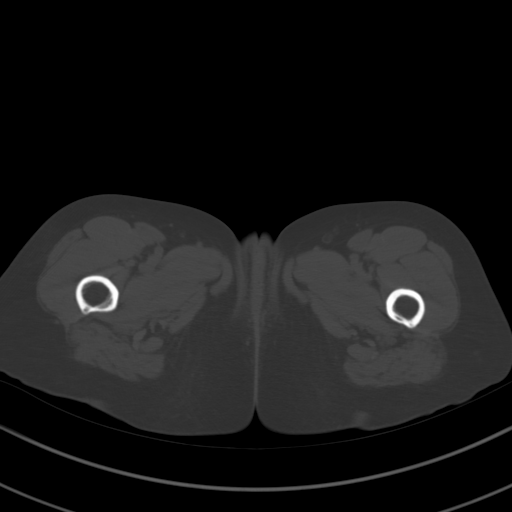
[im 10/83  soft-tissue]
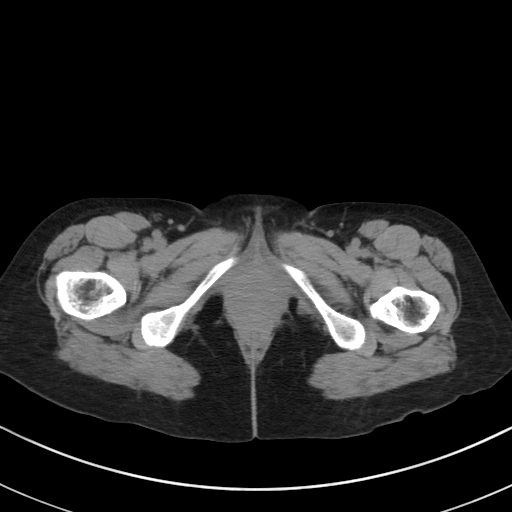
[im 17/83  soft-tissue]
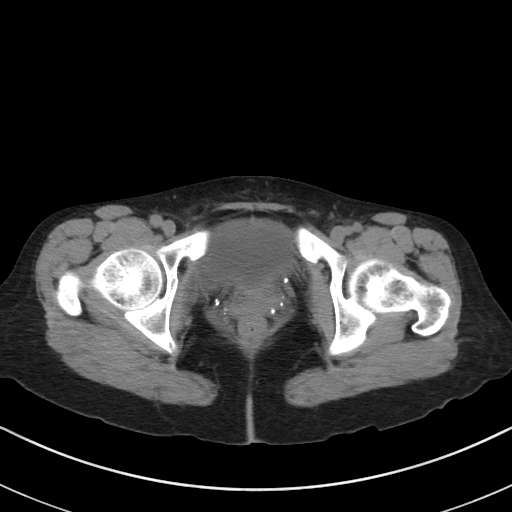
[im 23/83  soft-tissue]
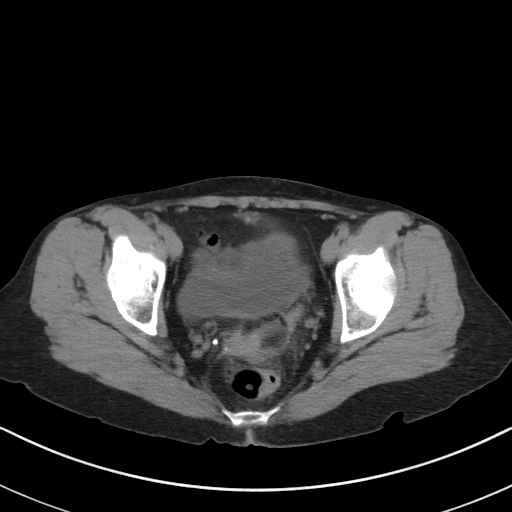
[im 27/83  soft-tissue]
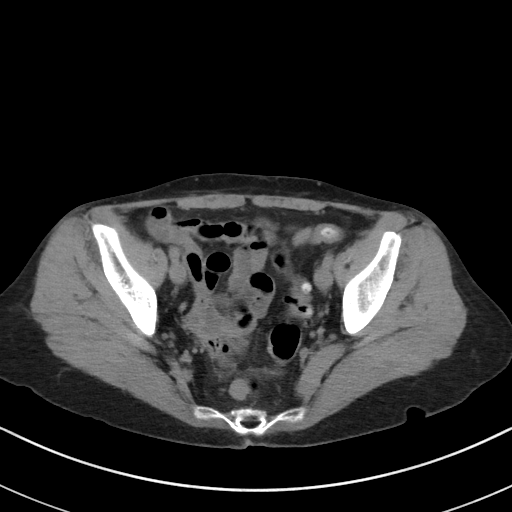
[im 33/83  soft-tissue]
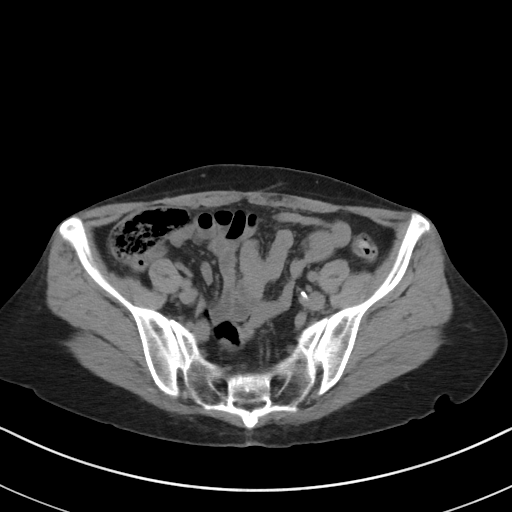
[im 40/83  soft-tissue]
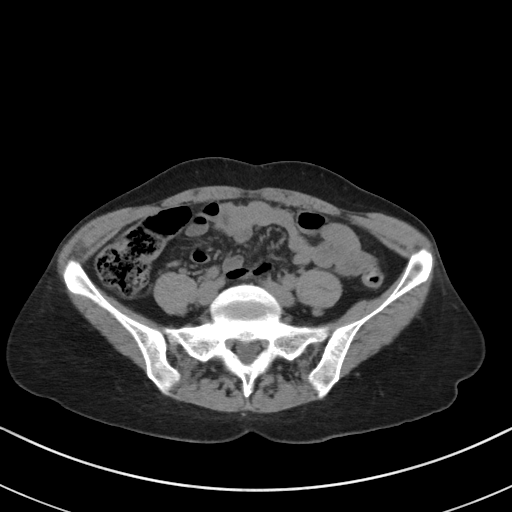
[im 43/83  soft-tissue]
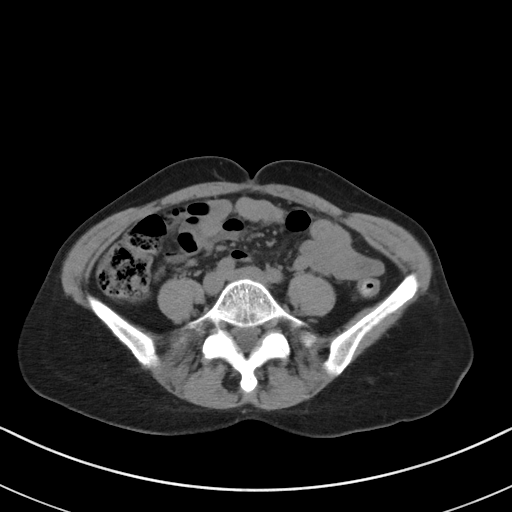
[im 50/83  soft-tissue]
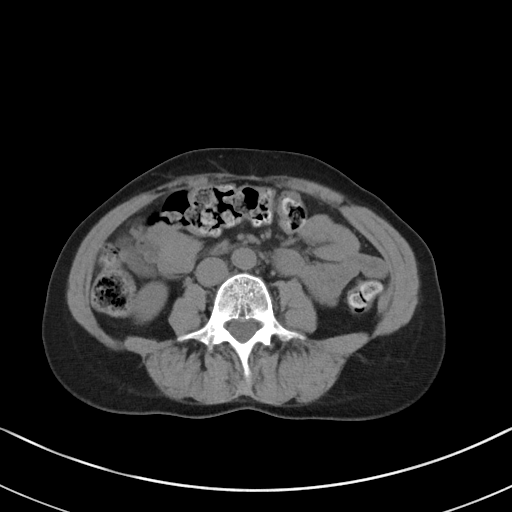
[im 50/83  bone]
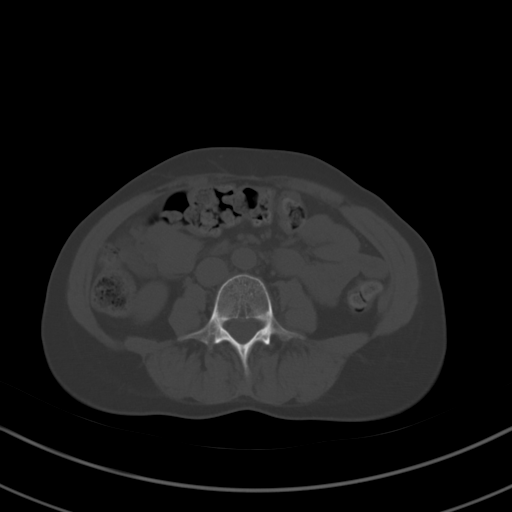
[im 56/83  soft-tissue]
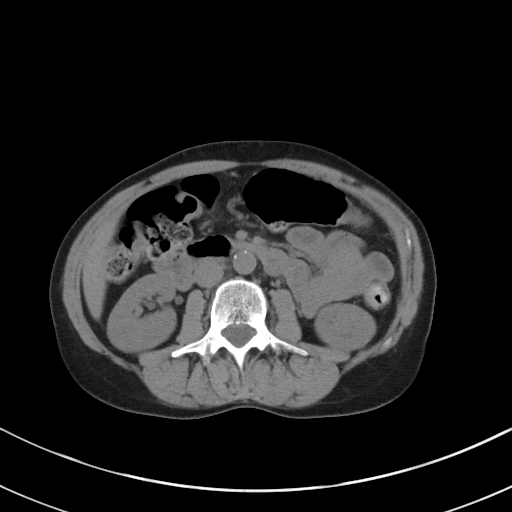
[im 63/83  soft-tissue]
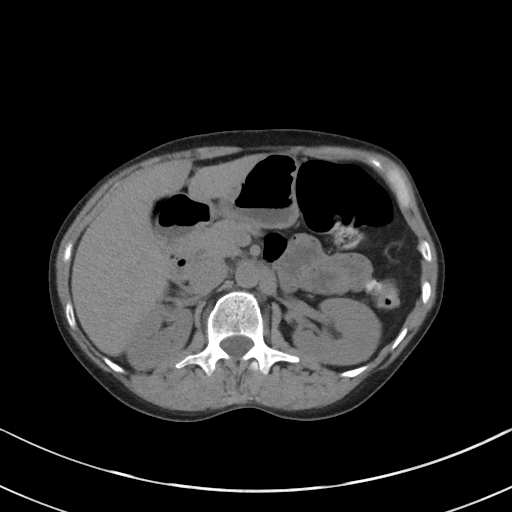
[im 66/83  soft-tissue]
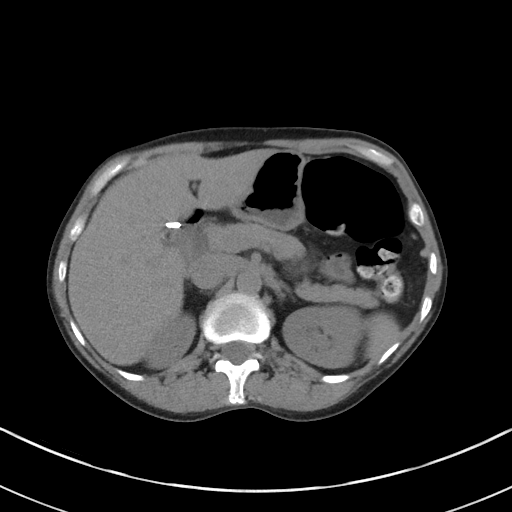
[im 73/83  soft-tissue]
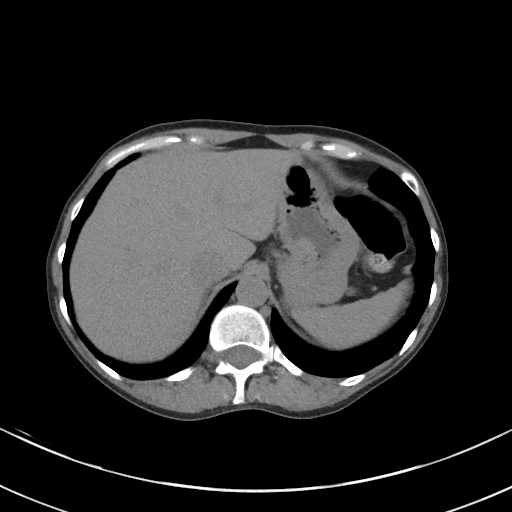
[im 79/83  soft-tissue]
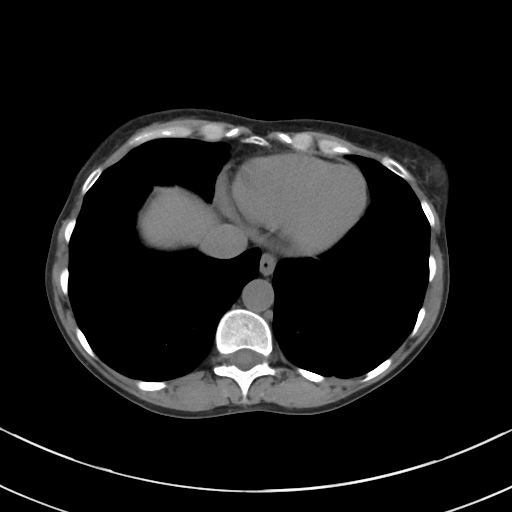

[Series 5: coronal st · coronal · 0.75mm/px · 3 of 101 slices shown]
[im 34/101  soft-tissue]
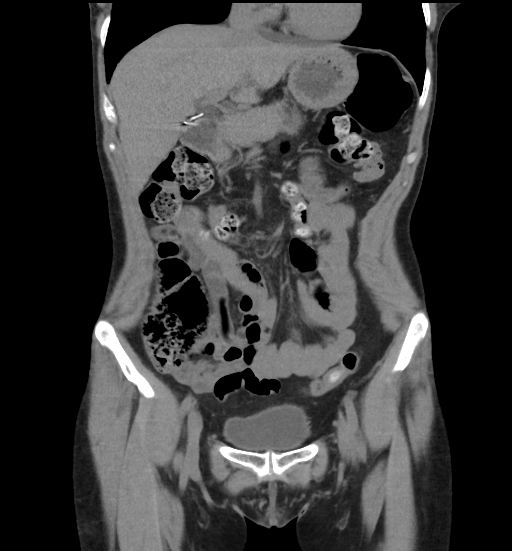
[im 45/101  soft-tissue]
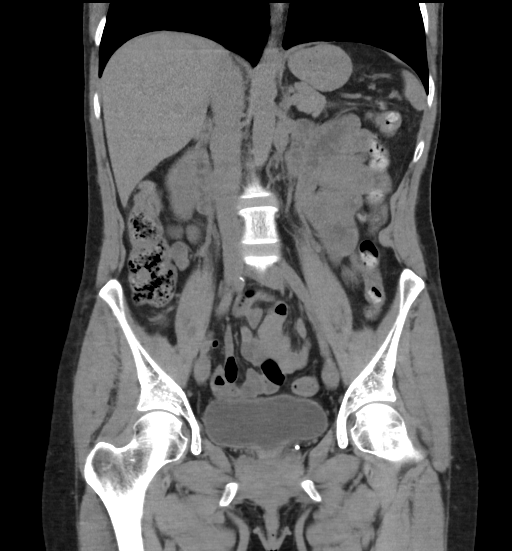
[im 56/101  soft-tissue]
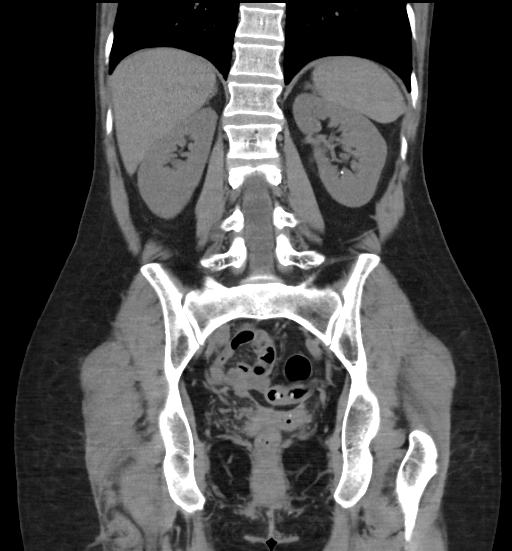

[17 of 46 positions shown; findings below may reference images not displayed]

FINDINGS: Lower chest: No acute findings.

Hepatobiliary: No mass visualized on this unenhanced exam. Prior
cholecystectomy. No evidence of biliary obstruction.

Pancreas: No mass or inflammatory process visualized on this
unenhanced exam.

Spleen:  Within normal limits in size.

Adrenals/Urinary tract: A few tiny less than 5 mm calculi are seen
in the lower pole the left kidney. No evidence of ureteral calculi
or hydronephrosis. Unremarkable unopacified urinary bladder.

Stomach/Bowel: No evidence of obstruction, inflammatory process, or
abnormal fluid collections. Mild diverticulosis is seen involving
the sigmoid colon, however there is no evidence of diverticulitis.
Normal appendix visualized.

Vascular/Lymphatic: No pathologically enlarged lymph nodes
identified. No evidence of abdominal aortic aneurysm.

Reproductive: Prior hysterectomy noted. Adnexal regions are
unremarkable in appearance.

Other:  None.

Musculoskeletal:  No suspicious bone lesions identified.
IMPRESSION: 1. Tiny nonobstructing left renal calculi. No evidence of ureteral
calculi, hydronephrosis, or other acute findings.
2. Colonic diverticulosis. No radiographic evidence of
diverticulitis.

## 2021-04-23 DIAGNOSIS — R1013 Epigastric pain: Secondary | ICD-10-CM | POA: Diagnosis not present

## 2021-04-23 DIAGNOSIS — R0789 Other chest pain: Secondary | ICD-10-CM | POA: Diagnosis not present

## 2022-10-25 ENCOUNTER — Emergency Department (HOSPITAL_BASED_OUTPATIENT_CLINIC_OR_DEPARTMENT_OTHER)
Admission: EM | Admit: 2022-10-25 | Discharge: 2022-10-25 | Disposition: A | Discharge status: Home / Self Care | Payer: BC Managed Care – PPO | Attending: Emergency Medicine | Admitting: Emergency Medicine

## 2022-10-25 ENCOUNTER — Encounter (HOSPITAL_BASED_OUTPATIENT_CLINIC_OR_DEPARTMENT_OTHER): Payer: Self-pay | Admitting: Pediatrics

## 2022-10-25 ENCOUNTER — Other Ambulatory Visit: Payer: Self-pay

## 2022-10-25 ENCOUNTER — Emergency Department (HOSPITAL_BASED_OUTPATIENT_CLINIC_OR_DEPARTMENT_OTHER): Payer: BC Managed Care – PPO

## 2022-10-25 DIAGNOSIS — R519 Headache, unspecified: Secondary | ICD-10-CM | POA: Diagnosis not present

## 2022-10-25 DIAGNOSIS — Z0389 Encounter for observation for other suspected diseases and conditions ruled out: Secondary | ICD-10-CM | POA: Diagnosis not present

## 2022-10-25 HISTORY — DX: Migraine, unspecified, not intractable, without status migrainosus: G43.909

## 2022-10-25 LAB — CBC WITH DIFFERENTIAL/PLATELET
Abs Immature Granulocytes: 0.02 10*3/uL (ref 0.00–0.07)
Basophils Absolute: 0 10*3/uL (ref 0.0–0.1)
Basophils Relative: 1 %
Eosinophils Absolute: 0 10*3/uL (ref 0.0–0.5)
Eosinophils Relative: 0 %
HCT: 42 % (ref 36.0–46.0)
Hemoglobin: 14.4 g/dL (ref 12.0–15.0)
Immature Granulocytes: 0 %
Lymphocytes Relative: 40 %
Lymphs Abs: 2.3 10*3/uL (ref 0.7–4.0)
MCH: 31.7 pg (ref 26.0–34.0)
MCHC: 34.3 g/dL (ref 30.0–36.0)
MCV: 92.5 fL (ref 80.0–100.0)
Monocytes Absolute: 0.3 10*3/uL (ref 0.1–1.0)
Monocytes Relative: 5 %
Neutro Abs: 3 10*3/uL (ref 1.7–7.7)
Neutrophils Relative %: 54 %
Platelets: 258 10*3/uL (ref 150–400)
RBC: 4.54 MIL/uL (ref 3.87–5.11)
RDW: 13.1 % (ref 11.5–15.5)
WBC: 5.7 10*3/uL (ref 4.0–10.5)
nRBC: 0 % (ref 0.0–0.2)

## 2022-10-25 LAB — BASIC METABOLIC PANEL
Anion gap: 11 (ref 5–15)
BUN: 16 mg/dL (ref 6–20)
CO2: 24 mmol/L (ref 22–32)
Calcium: 9.5 mg/dL (ref 8.9–10.3)
Chloride: 102 mmol/L (ref 98–111)
Creatinine, Ser: 0.73 mg/dL (ref 0.44–1.00)
GFR, Estimated: >60 mL/min (ref >60)
Glucose, Bld: 107 mg/dL — ABNORMAL HIGH (ref 70–99)
Potassium: 3.8 mmol/L (ref 3.5–5.1)
Sodium: 137 mmol/L (ref 135–145)

## 2022-10-25 MED ORDER — IOHEXOL 350 MG/ML SOLN
100.0000 mL | Freq: Once | INTRAVENOUS | Status: AC | PRN
  Administered 2022-10-25: 75 mL via INTRAVENOUS

## 2022-10-25 MED ORDER — DIPHENHYDRAMINE HCL 50 MG/ML IJ SOLN
25.0000 mg | Freq: Once | INTRAMUSCULAR | Status: AC
  Administered 2022-10-25: 25 mg via INTRAVENOUS
  Filled 2022-10-25: qty 1

## 2022-10-25 MED ORDER — PROCHLORPERAZINE EDISYLATE 10 MG/2ML IJ SOLN
10.0000 mg | Freq: Once | INTRAMUSCULAR | Status: AC
  Administered 2022-10-25: 10 mg via INTRAVENOUS
  Filled 2022-10-25: qty 2

## 2022-10-25 NOTE — ED Provider Notes (Signed)
3:35 PM Assumed care of patient from off-going team. For more details, please see note from same day.  In brief, this is a 57 y.o. female  Sudden onset worst headache of her life several days ago. Has come and gone twice.   Plan/Dispo at time of sign-out & ED Course since sign-out: [ ]  CTA H&N, CTV  BP 97/67   Pulse (!) 50   Temp 98.2 F (36.8 C)   Resp 18   Ht 5\' 2"  (1.575 m)   Wt 49.9 kg   SpO2 97%   BMI 20.12 kg/m    ED Course:   Clinical Course as of 10/25/22 1535  Tue Oct 25, 2022  1513 CT ANGIO HEAD NECK W WO CM 1. No acute intracranial abnormality. 2. No intracranial large vessel occlusion or significant stenosis. 3. No hemodynamically significant stenosis in the neck. 4. Patent dural venous sinuses. 5. 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   [HN]  1533 D/w patient and her family. She is very relieved at the negative imaging. 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 She also did not have any other nausea/vomiting, photophobia typically associated with her normal migraines. This was very different from her normal migraines. It lasted 30 minutes which is not traditionally c/w TGN, and she did not have any tearing/rhinorrhea/diaphoresis associated typically with cluster headaches. I encouraged patient to f/u with headache specialist at [facility] and to keep a detailed headache diary. Encouraged her to avoid common triggers for headaches including alcohol, dehydration and to take tylenol/ibuprofen if she gets another headache. Also recommended daily magnesium supplementation. Patient reports understanding. DC w/ discharge instructions/return precautions. All questions answered to patient's satisfaction.   [HN]    Clinical Course User Index [HN] Loetta Rough, MD    Dispo: DC ------------------------------- Vivi Barrack, MD Emergency Medicine  This  note was created using dictation software, which may contain spelling or grammatical errors.   Loetta Rough, MD 10/25/22 1535

## 2022-10-25 NOTE — ED Triage Notes (Signed)
Patient reports sudden headache on Sunday while she was reeling an 8lb fish, sustained for 30 mins, and returned later on that afternoon. Stated episode of headache again with no relief from OTC and improved with rest. Returned this morning that is stabbing in nature.

## 2022-10-25 NOTE — Discharge Instructions (Addendum)
Please follow up with [facility].  Return for worsening headache, one sided numbness or weakness, difficulty with speech or swallowing. Please keep detailed headache diary to bring to the neurologist when you see them.

## 2022-10-25 NOTE — ED Provider Notes (Signed)
[facility] AT [facility] Provider Note   CSN: [id number] Arrival date & time: 10/25/22  1021     History  Chief Complaint  Patient presents with   Headache    REMII [name] is a 57 y.o. female.  57 yo F with a chief complaint of headache.  This started a couple days ago.  She was fishing and she was reeling in a fashion and had a sudden excruciating headache.  Was severe in onset and lasted for about 30 minutes she thinks and then resolved.  She since then has had the headache recur.  Most commonly occurs with her doing some activity, she was loading her luggage into her car after fishing and I agree happened.  She was brushing her teeth the other day and it reoccurred.  She felt the headache was much more persistent yesterday.  Has felt a little bit dizzy off and on but not severely so.  Denies trauma to the head or neck denies one-sided numbness or weakness denies difficulty speech or swallowing.   Headache      Home Medications Prior to Admission medications   Medication Sig Start Date End Date Taking? Authorizing Provider  benzonatate (TESSALON PERLES) 100 MG capsule Take 1 capsule (100 mg total) by mouth 3 (three) times daily as needed. 06/25/18   Junie Spencer, FNP  cyclobenzaprine (FLEXERIL) 10 MG tablet Take 1 tablet (10 mg total) by mouth 2 (two) times daily as needed for muscle spasms. 04/07/18   Rolan Bucco, MD  naproxen (NAPROSYN) 500 MG tablet Take 1 tablet (500 mg total) by mouth 2 (two) times daily. Patient not taking: Reported on 04/13/2017 07/21/14   Hess, Nada Boozer, PA-C  traMADol (ULTRAM) 50 MG tablet Take 1 tablet (50 mg total) by mouth every 6 (six) hours as needed. Patient not taking: Reported on 04/13/2017 07/21/14   Kathrynn Speed, PA-C      Allergies    Patient has no known allergies.    Review of Systems   Review of Systems  Neurological:  Positive for headaches.    Physical Exam Updated Vital Signs BP (!) 125/91  (BP Location: Left Arm)   Pulse 95   Temp 98.2 F (36.8 C) (Oral)   Resp 18   Ht 5\' 2"  (1.575 m)   Wt 49.9 kg   SpO2 100%   BMI 20.12 kg/m  Physical Exam Vitals and nursing note reviewed.  Constitutional:      General: She is not in acute distress.    Appearance: She is well-developed. She is not diaphoretic.  HENT:     Head: Normocephalic and atraumatic.  Eyes:     Pupils: Pupils are equal, round, and reactive to light.  Cardiovascular:     Rate and Rhythm: Normal rate and regular rhythm.     Heart sounds: No murmur heard.    No friction rub. No gallop.  Pulmonary:     Effort: Pulmonary effort is normal.     Breath sounds: No wheezing or rales.  Abdominal:     General: There is no distension.     Palpations: Abdomen is soft.     Tenderness: There is no abdominal tenderness.  Musculoskeletal:        General: No tenderness.     Cervical back: Normal range of motion and neck supple.  Skin:    General: Skin is warm and dry.  Neurological:     Mental Status: She is alert and  oriented to person, place, and time.     GCS: GCS eye subscore is 4. GCS verbal subscore is 5. GCS motor subscore is 6.     Cranial Nerves: Cranial nerves 2-12 are intact.     Sensory: Sensation is intact.     Motor: Motor function is intact.     Coordination: Coordination is intact.     Comments: Benign neuro exam  Psychiatric:        Behavior: Behavior normal.     ED Results / Procedures / Treatments   Labs (all labs ordered are listed, but only abnormal results are displayed) Labs Reviewed  CBC WITH DIFFERENTIAL/PLATELET  BASIC METABOLIC PANEL    EKG None  Radiology No results found.  Procedures Procedures    Medications Ordered in ED Medications  prochlorperazine (COMPAZINE) injection 10 mg (has no administration in time range)  diphenhydrAMINE (BENADRYL) injection 25 mg (has no administration in time range)    ED Course/ Medical Decision Making/ A&P                                  Medical Decision Making Amount and/or Complexity of Data Reviewed Labs: ordered. Radiology: ordered.  Risk Prescription drug management.   57 yo F with a chief complaints of a sudden onset headache that occurred while she was exerting herself.  Since then this pain has come and gone.  She has a benign neurologic exam.  Worried about subarachnoid this seems less likely with off and on symptoms.  No neck pain but wonder venous sinus thrombosis versus carotid artery dissection.  Will obtain CT angiogram of the head and neck.  CT venogram.  Headache cocktail.  Reassess.  No significant electrolyte abnormality.  No leukocytosis. No anemia.   Signed out to Dr. Jearld Fenton, please see their note for further details of care in the ED.  The patients results and plan were reviewed and discussed.   Any x-rays performed were independently reviewed by myself.   Differential diagnosis were considered with the presenting HPI.  Medications  prochlorperazine (COMPAZINE) injection 10 mg (10 mg Intravenous Given 10/25/22 1129)  diphenhydrAMINE (BENADRYL) injection 25 mg (25 mg Intravenous Given 10/25/22 1130)  iohexol (OMNIPAQUE) 350 MG/ML injection 100 mL (75 mLs Intravenous Contrast Given 10/25/22 1240)    Vitals:   10/25/22 1029 10/25/22 1030 10/25/22 1330 10/25/22 1415  BP: (!) 125/91  106/69 98/74  Pulse: 95  (!) 59 (!) 53  Resp: 18  18 18   Temp: 98.2 F (36.8 C)  98.2 F (36.8 C) 98.2 F (36.8 C)  TempSrc: Oral     SpO2: 100%  99% 98%  Weight:  49.9 kg    Height:  5\' 2"  (1.575 m)      Final diagnoses:  Temporal headache            Final Clinical Impression(s) / ED Diagnoses Final diagnoses:  None    Rx / DC Orders ED Discharge Orders     None         Melene Plan, DO 10/25/22 1442

## 2022-10-27 ENCOUNTER — Ambulatory Visit: Payer: BC Managed Care – PPO | Admitting: Neurology

## 2022-10-27 ENCOUNTER — Encounter: Payer: Self-pay | Admitting: Neurology

## 2022-10-27 VITALS — BP 123/76 | HR 83 | Ht 62.0 in | Wt 102.2 lb

## 2022-10-27 DIAGNOSIS — R519 Headache, unspecified: Secondary | ICD-10-CM | POA: Diagnosis not present

## 2022-10-27 DIAGNOSIS — R0683 Snoring: Secondary | ICD-10-CM

## 2022-10-27 DIAGNOSIS — M47812 Spondylosis without myelopathy or radiculopathy, cervical region: Secondary | ICD-10-CM | POA: Diagnosis not present

## 2022-10-27 DIAGNOSIS — G4440 Drug-induced headache, not elsewhere classified, not intractable: Secondary | ICD-10-CM

## 2022-10-27 DIAGNOSIS — Z9189 Other specified personal risk factors, not elsewhere classified: Secondary | ICD-10-CM

## 2022-10-27 DIAGNOSIS — Z789 Other specified health status: Secondary | ICD-10-CM

## 2022-10-27 NOTE — Patient Instructions (Addendum)
It was nice to meet you today.  Your neurological exam is normal.  Please remember, common headache triggers are: sleep deprivation, dehydration, overheating, stress, hypoglycemia or skipping meals and blood sugar fluctuations, excessive pain medications or excessive alcohol use or caffeine withdrawal. Some people have food triggers such as aged cheese, orange juice or chocolate, especially dark chocolate, or MSG (monosodium glutamate). Try to avoid these headache triggers as much possible. It may be helpful to keep a headache diary to figure out what makes your headaches worse or brings them on and what alleviates them. Some people report headache onset after exercise but studies have shown that regular exercise may actually prevent headaches from coming. If you have exercise-induced headaches, please make sure that you drink plenty of fluid before and after exercising and that you do not over do it and do not overheat.  Here is what we discussed today and my recommendations to you: Establish care with a primary care provider. There are medical conditions that can cause headaches. Get a full dilated eye exam. Strain on the eyes can cause headaches.  We will check blood work today and call you with the test results. We will do a brain scan, called MRI and call you with the test results. We will have to schedule you for this on a separate date. This test requires authorization from your insurance, and we will take care of the insurance process. I will order a home sleep test for evaluation of possible sleep apnea. If you have sleep apnea, I will likely recommend treatment with an autoPAP machine.   Stop goody powder, as medication overuse may exacerbate your headaches.  Increase water intake, as dehydration can worsen headaches.  Decrease caffeine and limit to up to 2 servings per day, as excessive caffeine can perpetuate headaches.  Please stop smoking.

## 2022-10-27 NOTE — Progress Notes (Signed)
Subjective:    Patient ID: Savannah [name] is a 57 y.o. female.  HPI    Huston Foley, MD, PhD [facility] [address], [address] [address] [place], [place] [address]  I saw patient, Savannah [name], as a referral from the emergency room for headache.  The patient is unaccompanied today.  Savannah [name] is a 58 year old female with an underlying medical history of TMJ dysfunction and migraine headaches, who reports a new onset severe headache about 5 days ago.  She started having a severe sudden headache in the bifrontal area when she was out and about, she was fishing, she was not stressed out.  She had no accompanying photophobia or nausea or vomiting or neurological symptoms otherwise at the time but the headache was sharp and severe and lasted for about half an hour and then reduced in severity.  She does admit to having recurrent headaches and takes Goody powder almost daily, in fact about 2/day on average.  She reports that in the evening the headache returned and she eventually went to the emergency room.  She currently does not have a primary care but is in the process of finding 1.  She admits that she has not been taking care of herself and she admits to stress and not sleeping well.  She reports that she lost her son a few years ago and has not been sleeping well since then.  She does report snoring.  She has limited water, drinks about 1 bottle per day on average.  She drinks quite a bit of caffeine in the form of soda.  She smokes about 1 pack/day and would like to work on smoking cessation.  She had a formal eye exam in over 5 years, she does have prescription eyeglasses, had a optician appointment about 3 months ago.  She lives with her significant other of 12 years.  She has 3 sons living, 1 son passed away at the age of 78.  She has intermittent blurry vision, no double vision, no loss of vision.  She does not endorse any sudden onset of one-sided weakness or numbness  or tingling or droopy face or slurring of speech.  She drinks alcohol very occasionally.    Of note, she presented to the emergency room on 10/25/2022 with a several day history of severe headache.  I reviewed the emergency room records.  She was treated symptomatically with prochlorperazine as well as diphenhydramine.  BMP and CBC were benign.  She had a CT angiogram of the head and neck with and without contrast as well as CT venogram of the head on 10/25/2022 and I reviewed the results:  IMPRESSION: 1. No acute intracranial abnormality. 2. No intracranial large vessel occlusion or significant stenosis. 3. No hemodynamically significant stenosis in the neck. 4. Patent dural venous sinuses. 5. 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 Upon D/C from the ED, she was advised to keep a headache diary and start magnesium over-the-counter.  She was not advised to follow-up or establish with PCP.  Her Past Medical History Is Significant For: Past Medical History:  Diagnosis Date   Migraine    since 1995 rare occurence    Her Past Surgical History Is Significant For: Past Surgical History:  Procedure Laterality Date   ABDOMINAL HYSTERECTOMY     AUGMENTATION MAMMAPLASTY Bilateral 1997   BREAST ENHANCEMENT SURGERY     BREAST EXCISIONAL BIOPSY Bilateral    breast  tumors     CESAREAN SECTION     CHOLECYSTECTOMY      Her Family History Is Significant For: Family History  Problem Relation Age of Onset   Migraines Mother    Diabetes Father    Cancer Father        colon   COPD Father     Her Social History Is Significant For: Social History   Socioeconomic History   Marital status: Single    Spouse name: Not on file   Number of children: Not on file   Years of education: Not on file   Highest education level: Not on file  Occupational History   Not on file  Tobacco Use   Smoking status: Every Day    Current packs/day: 1.00     Average packs/day: 1 pack/day for 28.0 years (28.0 ttl pk-yrs)    Types: Cigarettes   Smokeless tobacco: Never  Vaping Use   Vaping status: Some Days  Substance and Sexual Activity   Alcohol use: Not Currently    Comment: 2-3   Drug use: No   Sexual activity: Yes    Birth control/protection: Surgical  Other Topics Concern   Not on file  Social History Narrative   Caffiene dr pepper 1 liter   Working: Advertising account planner   Social Determinants of Health   Financial Resource Strain: Not on file  Food Insecurity: Not on file  Transportation Needs: Not on file  Physical Activity: Inactive (04/13/2017)   Exercise Vital Sign    Days of Exercise per Week: 0 days    Minutes of Exercise per Session: 0 min  Stress: Stress Concern Present (04/13/2017)   [employer] of Occupational Health - Occupational Stress Questionnaire    Feeling of Stress : Rather much  Social Connections: Moderately Integrated (04/13/2017)   Social Connection and Isolation Panel [NHANES]    Frequency of Communication with Friends and Family: More than three times a week    Frequency of Social Gatherings with Friends and Family: Once a week    Attends Religious Services: More than 4 times per year    Active Member of [employer] or Organizations: No    Attends Banker Meetings: Never    Marital Status: Living with partner    Her Allergies Are:  No Known Allergies:   Her Current Medications Are:  Outpatient Encounter Medications as of 10/27/2022  Medication Sig   Aspirin-Acetaminophen-Caffeine (GOODY HEADACHE PO) Take by mouth. Takes 1-2 daily for headaches   Multiple Vitamin (MULTIVITAMIN) tablet Take 1 tablet by mouth daily.   [DISCONTINUED] benzonatate (TESSALON PERLES) 100 MG capsule Take 1 capsule (100 mg total) by mouth 3 (three) times daily as needed.   [DISCONTINUED] cyclobenzaprine (FLEXERIL) 10 MG tablet Take 1 tablet (10 mg total) by mouth 2 (two) times daily as needed for muscle spasms.    [DISCONTINUED] naproxen (NAPROSYN) 500 MG tablet Take 1 tablet (500 mg total) by mouth 2 (two) times daily. (Patient not taking: Reported on 04/13/2017)   [DISCONTINUED] traMADol (ULTRAM) 50 MG tablet Take 1 tablet (50 mg total) by mouth every 6 (six) hours as needed. (Patient not taking: Reported on 04/13/2017)   No facility-administered encounter medications on file as of 10/27/2022.  :   Review of Systems:  Out of a complete 14 point review of systems, all are reviewed and negative with the exception of these symptoms as listed below:   Review of Systems  Neurological:        This  last Sunday, Forehead pain (duration ). , Monday headache, Tuesday R sided stabbing pain,  Cthead and neck negative.  ? Imaging narrowing of cervical spine.  ESS 11, FSS 18.    Objective:  Neurological Exam  Physical Exam Physical Examination:   Vitals:   10/27/22 1246  BP: 123/76  Pulse: 83    General Examination: The patient is a very pleasant 57 y.o. female in no acute distress. She appears well-developed and well-nourished and well groomed.   HEENT: Normocephalic, atraumatic, pupils are equal, round and reactive to light, no photophobia.  Funduscopic exam benign.  Extraocular tracking is good without limitation to gaze excursion or nystagmus noted. Hearing is grossly intact. Face is symmetric with normal facial animation and normal facial sensation to light touch, temperature and vibration sense. Speech is clear with no dysarthria noted. There is no hypophonia. There is no lip, neck/head, jaw or voice tremor. Neck is supple with full range of passive and active motion. There are no carotid bruits on auscultation. Oropharynx exam reveals: mild mouth dryness, adequate dental hygiene and mild airway crowding, due to smaller airway, tonsils on the smaller side, Mallampati class III.  Tongue protrudes centrally and palate elevates symmetrically.  Chest: Clear to auscultation without wheezing, rhonchi or  crackles noted.  Heart: S1+S2+0, regular and normal without murmurs, rubs or gallops noted.   Abdomen: Soft, non-tender and non-distended.  Extremities: There is no pitting edema in the distal lower extremities bilaterally.   Skin: Warm and dry without trophic changes noted.   Musculoskeletal: exam reveals no obvious joint deformities.   Neurologically:  Mental status: The patient is awake, alert and oriented in all 4 spheres. Her immediate and remote memory, attention, language skills and fund of knowledge are appropriate. There is no evidence of aphasia, agnosia, apraxia or anomia. Speech is clear with normal prosody and enunciation. Thought process is linear. Mood is normal and affect is normal.  Cranial nerves II - XII are as described above under HEENT exam.  Motor exam: Normal bulk, strength and tone is noted. There is no drift or rebound, no postural, action, or intention tremor, no resting tremor.   Fine motor skills and coordination: intact finger taps, hand movements and rapid alternating patting in both upper extremities, normal foot taps bilaterally in the lower extremities. Reflexes 2+ throughout, toes are downgoing bilaterally. Cerebellar testing: No dysmetria or intention tremor. There is no truncal or gait ataxia.  Normal finger-to-nose, normal heel-to-shin bilaterally. Sensory exam: intact to light touch, temperature and vibration sense in the upper and lower extremities.  Romberg is negative. Gait, station and balance: She stands easily. No veering to one side is noted. No leaning to one side is noted. Posture is age-appropriate and stance is narrow based. Gait shows normal stride length and normal pace. No problems turning are noted.  Normal tandem walk.  Assessment and Plan:  In summary, Savannah [name] is a very pleasant 57 y.o.-year old female 57 year old female with an underlying medical history of TMJ dysfunction and migraine headaches, who who presents for evaluation  of her recurrent headaches, particularly recent severe headaches about 5 days ago.  She currently is without severe headache, neurological exam nonfocal.  This was an extended visit of over 70 minutes, with extended chart review, multiple issues addressed, extensive counseling and coordination of care involved.  This was a complex visit. She likely has a mixed type of headache including breast related headaches, headaches from sleep deprivation, underlying sleep disordered breathing not excluded,  headaches from strain on the eyes, headaches from medication overuse as she uses Goody powder every day, also headaches from excessive caffeine use.  Below is a summary of the discussion points from today's visit and the recommendations are provided for her.  She was given these instructions and recommendations verbally and also in her MyChart after visit summary:  << Establish care with a primary care provider. There are medical conditions that can cause headaches. Get a full dilated eye exam. Strain on the eyes can cause headaches.  We will check blood work today and call you with the test results. We will do a brain scan, called MRI and call you with the test results. We will have to schedule you for this on a separate date. This test requires authorization from your insurance, and we will take care of the insurance process. I will order a home sleep test for evaluation of possible sleep apnea. If you have sleep apnea, I will likely recommend treatment with an autoPAP machine.  Stop goody powder, as medication overuse may exacerbate your headaches.  Increase water intake, as dehydration can worsen headaches.  Decrease caffeine and limit to up to 2 servings per day, as excessive caffeine can perpetuate headaches.  Please stop smoking. >>  We will keep her posted as to her blood test results, brain MRI results, and home sleep test results by phone call and/or MyChart messaging and follow-up accordingly.  I  answered all her questions today and she was in agreement with our plan.  Huston Foley, MD, PhD

## 2022-10-28 LAB — CBC WITH DIFFERENTIAL/PLATELET
Basophils Absolute: 0 10*3/uL (ref 0.0–0.2)
Basos: 1 %
EOS (ABSOLUTE): 0 10*3/uL (ref 0.0–0.4)
Eos: 0 %
Hematocrit: 44.9 % (ref 34.0–46.6)
Hemoglobin: 15 g/dL (ref 11.1–15.9)
Immature Grans (Abs): 0 10*3/uL (ref 0.0–0.1)
Immature Granulocytes: 0 %
Lymphocytes Absolute: 2.5 10*3/uL (ref 0.7–3.1)
Lymphs: 38 %
MCH: 32.5 pg (ref 26.6–33.0)
MCHC: 33.4 g/dL (ref 31.5–35.7)
MCV: 97 fL (ref 79–97)
Monocytes Absolute: 0.3 10*3/uL (ref 0.1–0.9)
Monocytes: 5 %
Neutrophils Absolute: 3.7 10*3/uL (ref 1.4–7.0)
Neutrophils: 56 %
Platelets: 271 10*3/uL (ref 150–450)
RBC: 4.62 x10E6/uL (ref 3.77–5.28)
RDW: 12.5 % (ref 11.7–15.4)
WBC: 6.5 10*3/uL (ref 3.4–10.8)

## 2022-10-28 LAB — COMPREHENSIVE METABOLIC PANEL
ALT: 15 [IU]/L (ref 0–32)
AST: 18 [IU]/L (ref 0–40)
Albumin: 4.7 g/dL (ref 3.8–4.9)
Alkaline Phosphatase: 99 [IU]/L (ref 44–121)
BUN/Creatinine Ratio: 22 (ref 9–23)
BUN: 15 mg/dL (ref 6–24)
Bilirubin Total: 0.2 mg/dL (ref 0.0–1.2)
CO2: 24 mmol/L (ref 20–29)
Calcium: 9.7 mg/dL (ref 8.7–10.2)
Chloride: 103 mmol/L (ref 96–106)
Creatinine, Ser: 0.69 mg/dL (ref 0.57–1.00)
Globulin, Total: 2.3 g/dL (ref 1.5–4.5)
Glucose: 91 mg/dL (ref 70–99)
Potassium: 4 mmol/L (ref 3.5–5.2)
Sodium: 140 mmol/L (ref 134–144)
Total Protein: 7 g/dL (ref 6.0–8.5)
eGFR: 102 mL/min/{1.73_m2} (ref >59)

## 2022-10-28 LAB — ANA W/REFLEX: Anti Nuclear Antibody (ANA): NEGATIVE

## 2022-10-28 LAB — C-REACTIVE PROTEIN: CRP: <1 mg/L (ref 0–10)

## 2022-10-28 LAB — SEDIMENTATION RATE: Sed Rate: 5 mm/h (ref 0–40)

## 2022-10-28 LAB — RHEUMATOID FACTOR: Rheumatoid fact SerPl-aCnc: <10 [IU]/mL (ref <14.0)

## 2022-10-28 LAB — VITAMIN D 25 HYDROXY (VIT D DEFICIENCY, FRACTURES): Vit D, 25-Hydroxy: 56.2 ng/mL (ref 30.0–100.0)

## 2022-10-28 LAB — TSH: TSH: 1.34 u[IU]/mL (ref 0.450–4.500)

## 2022-10-28 LAB — HGB A1C W/O EAG: Hgb A1c MFr Bld: 5.5 % (ref 4.8–5.6)

## 2022-11-07 ENCOUNTER — Telehealth: Payer: Self-pay | Admitting: Neurology

## 2022-11-07 NOTE — Telephone Encounter (Signed)
Pt scheduled for 45 mins MR brain w/wo contrast at [facility] for 11/15/22 at 2pm  [employer] auth# [id number] (11/02/22-12/01/22)

## 2022-11-13 IMAGING — DX DG CHEST 1V PORT
1 series · 1 of 1 positions shown · non-contrast
Comparison: Chest radiographs 03/06/2017.

CLINICAL DATA: 55-year-old female with sore throat, fever headache
and cough for 1 week. Smoker.

EXAM:
PORTABLE CHEST 1 VIEW

[chest ap]
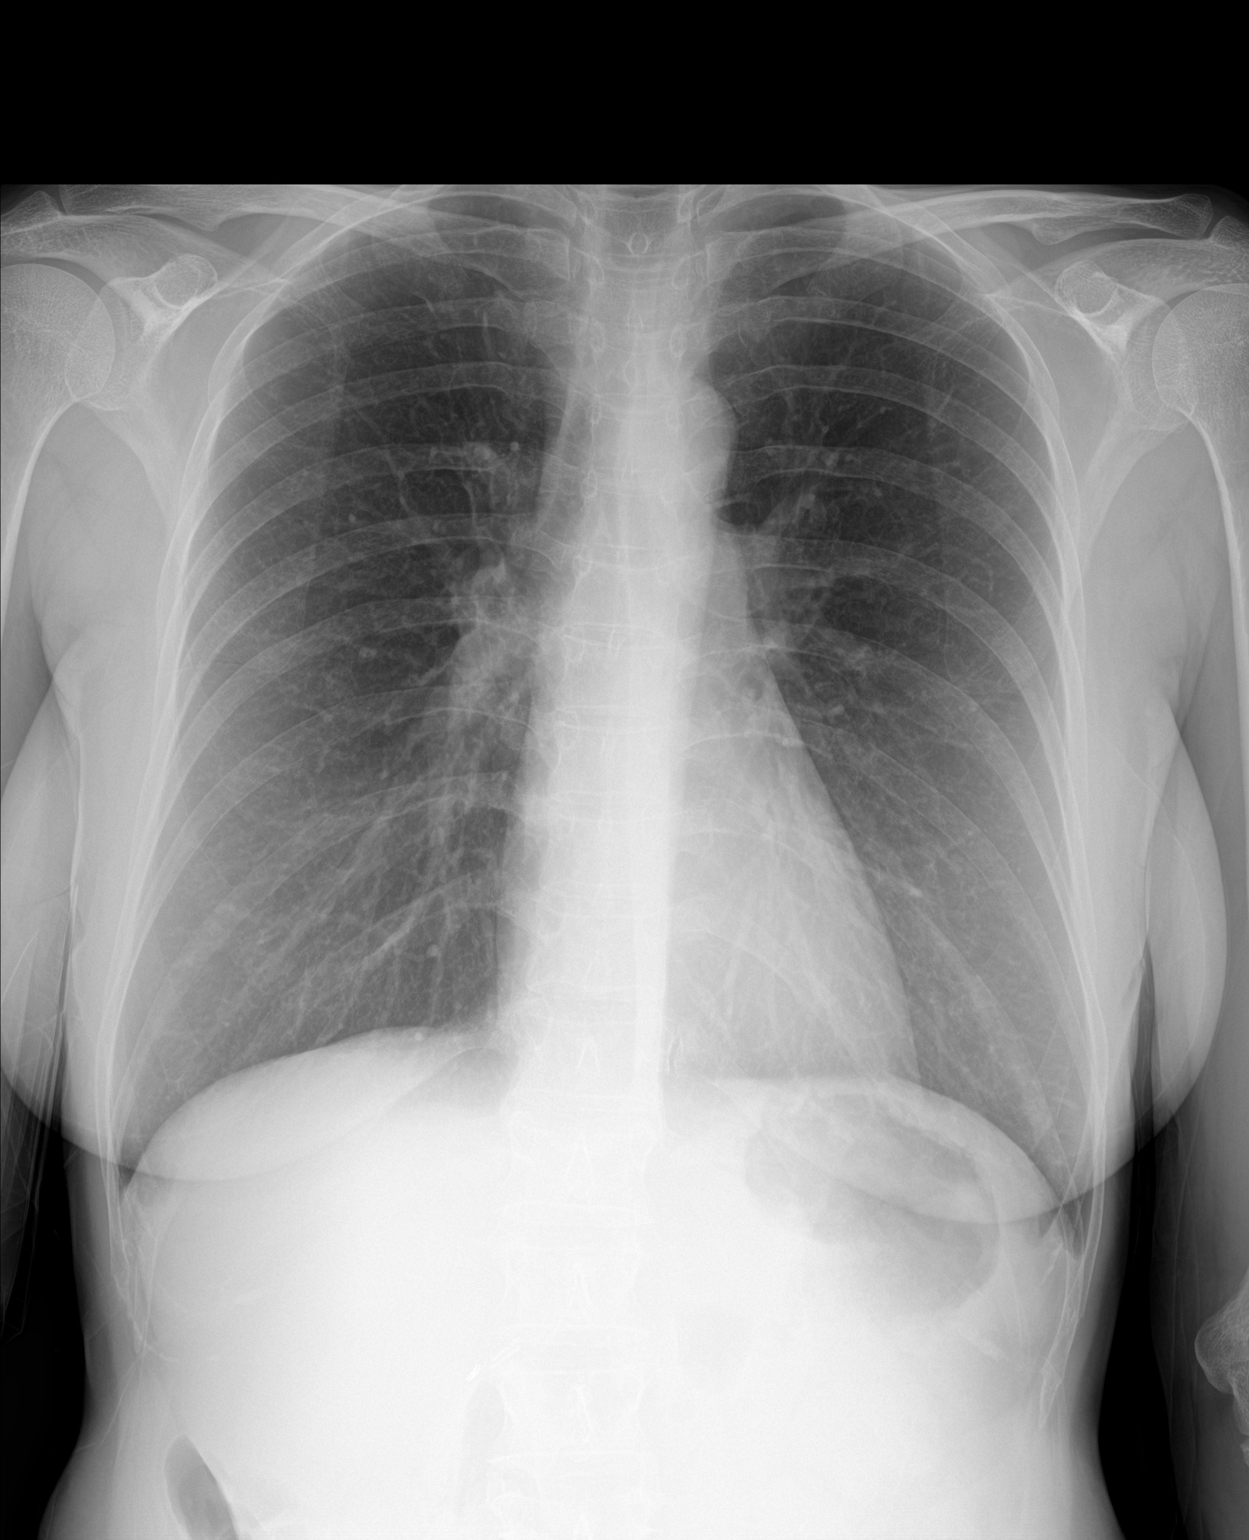

[1 of 1 positions shown; findings below may reference images not displayed]

FINDINGS: Portable AP upright view at 5889 hours. Lung volumes are chronically
borderline to hyperinflated. Mediastinal contours are stable and
within normal limits. Visualized tracheal air column is within
normal limits. Allowing for portable technique the lungs are clear.
No pneumothorax or pleural effusion. Stable cholecystectomy clips.
Negative visible bowel gas and osseous structures.
IMPRESSION: Negative portable chest.

## 2022-11-15 ENCOUNTER — Ambulatory Visit (INDEPENDENT_AMBULATORY_CARE_PROVIDER_SITE_OTHER): Payer: BC Managed Care – PPO

## 2022-11-15 DIAGNOSIS — M47812 Spondylosis without myelopathy or radiculopathy, cervical region: Secondary | ICD-10-CM

## 2022-11-15 DIAGNOSIS — G4440 Drug-induced headache, not elsewhere classified, not intractable: Secondary | ICD-10-CM | POA: Diagnosis not present

## 2022-11-15 DIAGNOSIS — Z789 Other specified health status: Secondary | ICD-10-CM

## 2022-11-15 DIAGNOSIS — R0683 Snoring: Secondary | ICD-10-CM | POA: Diagnosis not present

## 2022-11-15 DIAGNOSIS — R519 Headache, unspecified: Secondary | ICD-10-CM

## 2022-11-15 DIAGNOSIS — Z9189 Other specified personal risk factors, not elsewhere classified: Secondary | ICD-10-CM

## 2022-11-15 MED ORDER — GADOBENATE DIMEGLUMINE 529 MG/ML IV SOLN
9.0000 mL | Freq: Once | INTRAVENOUS | Status: AC | PRN
  Administered 2022-11-15: 9 mL via INTRAVENOUS

## 2022-11-17 ENCOUNTER — Ambulatory Visit: Payer: BC Managed Care – PPO | Admitting: Neurology

## 2022-11-17 DIAGNOSIS — G4733 Obstructive sleep apnea (adult) (pediatric): Secondary | ICD-10-CM | POA: Diagnosis not present

## 2022-11-17 DIAGNOSIS — Z789 Other specified health status: Secondary | ICD-10-CM

## 2022-11-17 DIAGNOSIS — Z9189 Other specified personal risk factors, not elsewhere classified: Secondary | ICD-10-CM

## 2022-11-17 DIAGNOSIS — G4440 Drug-induced headache, not elsewhere classified, not intractable: Secondary | ICD-10-CM

## 2022-11-17 DIAGNOSIS — R519 Headache, unspecified: Secondary | ICD-10-CM

## 2022-11-17 DIAGNOSIS — M47812 Spondylosis without myelopathy or radiculopathy, cervical region: Secondary | ICD-10-CM

## 2022-11-17 DIAGNOSIS — R0683 Snoring: Secondary | ICD-10-CM

## 2022-11-30 NOTE — Procedures (Signed)
[facility]  HOME SLEEP TEST (SANSA) REPORT (Mail-Out Device):   STUDY DATE: 11/21/2022   DOB: 05/22/1965  MRN: [id number]  ORDERING CLINICIAN: Huston Foley, MD, PhD   REFERRING CLINICIAN: ED  CLINICAL INFORMATION/HISTORY: 57 year old female with an underlying medical history of TMJ dysfunction and migraine headaches, who reports recurrent headaches.  She reports snoring and not sleeping well.    BMI (at the time of sleep clinic visit and/or test date): 18.7 kg/m  FINDINGS:   Study Protocol:    The SANSA single-point-of-skin-contact chest-worn sensor - an FDA and DOT approved type 4 home sleep test device - measures eight physiological channels,  including blood oxygen saturation (measured via PPG [photoplethysmography]), EKG-derived heart rate, respiratory effort, chest movement (measured via accelerometer), snoring, body position, and actigraphy. The device is designed to be worn for up to 10 hours per study.   Sleep Summary:   Total Recording Time (hours, min): 7 hours, 15 min  Total Effective Sleep Time (hours, min):  4 hours, 44 min  Sleep Efficiency (%):    72%   Respiratory Indices:   Calculated sAHI (per hour):  14.3/hour         Oxygen Saturation Statistics:    Oxygen Saturation (%) Mean: 93%   Minimum oxygen saturation (%):                 75.2%   O2 Saturation Range (%): 75.2-100%    Pulse Rate Statistics:   Pulse Mean (bpm):    55/min    Pulse Range (42-109/min)   Snoring: Mild, intermittent  IMPRESSION/DIAGNOSES:   OSA (obstructive sleep apnea), mild  RECOMMENDATIONS:   This home sleep test demonstrates overall mild obstructive sleep apnea with a total AHI of 14.3/hour and O2 nadir of 75.2%. Snoring was detected, and appeared to be mild and intermittent. Given the patient's medical history and sleep related complaints, therapy with a  positive airway pressure device is a reasonable first-line choice and clinically  recommended. Treatment can be achieved in the form of autoPAP trial/titration at home for now. A full night, in-lab PAP titration study may aid in improving proper treatment settings and with mask fit, if needed, down the road. Alternative treatments - generally speaking - may include weight loss (where appropriate) along with avoidance of the supine sleep position (if possible), or an oral appliance in appropriate candidates.   Please note that untreated obstructive sleep apnea may carry additional perioperative morbidity. Patients with significant obstructive sleep apnea should receive perioperative PAP therapy and the surgeons and particularly the anesthesiologist should be informed of the diagnosis and the severity of the sleep disordered breathing. The patient should be cautioned not to drive, work at heights, or operate dangerous or heavy equipment when tired or sleepy. Review and reiteration of good sleep hygiene measures should be pursued with any patient. Other causes of the patient's symptoms, including circadian rhythm disturbances, an underlying mood disorder, medication effect and/or an underlying medical problem cannot be ruled out based on this test. Clinical correlation is recommended.  The patient and her referring provider will be notified of the test results. The patient will be seen in follow up in sleep clinic at [facility], as necessary.  I certify that I have reviewed the raw data recording prior to the issuance of this report in accordance with the standards of the American Academy of Sleep Medicine (AASM).    INTERPRETING PHYSICIAN:   Huston Foley, MD, PhD Medical Director, [facility] (  [facility]) Diplomat, ABPN (Neurology and Sleep)   [facility] [address] [place], [place] [address] [phone]

## 2022-11-30 NOTE — Addendum Note (Signed)
Addended by: Huston Foley on: 11/30/2022 12:46 PM   Modules accepted: Orders

## 2022-12-05 ENCOUNTER — Telehealth: Payer: Self-pay | Admitting: *Deleted

## 2022-12-05 NOTE — Telephone Encounter (Signed)
Called pt & LVM (ok per DPR) advising we have her SS results to discuss. SS showed overall mild/borderline OSA. Dr Frances Furbish does feel that treatment will help the patient feel better. I left our office number and asked pt to call us back to discuss the next steps in more detail.

## 2022-12-05 NOTE — Telephone Encounter (Signed)
-----   Message from Huston Foley sent at 11/30/2022 12:46 PM EST ----- Patient referred by the ED for HAs, seen by me on 10/27/2022, patient had HST on 11/21/2022.    Please call and notify the patient that the recent home sleep test showed obstructive sleep apnea. OSA is overall mild, but worth treating to see if she feels better after treatment. To that end I recommend treatment for this in the form of autoPAP, which means, that we don't have to bring her in for a sleep study with CPAP, but will let her try an autoPAP machine at home, through a DME company (of her choice, or as per insurance requirement). The DME representative will educate her on how to use the machine, how to put the mask on, etc. I have placed an order in the chart. Please send referral, talk to patient, send report to referring MD. We will need a FU in sleep clinic in about 2.-3 months post-PAP set up (which is usually an insurance-mandated appointment to monitor compliance), please arrange that with me or one of our NPs. Please also go over the need for compliance with treatment (including the insurance-imposed minimum compliance percentage). Thanks,   Huston Foley, MD, PhD [facility])

## 2022-12-08 NOTE — Telephone Encounter (Signed)
I called the pt & LVM (ok per DPR) again asking for call back to discuss pt's SS results and the next steps. I advised mild OSA found but Dr Frances Furbish states it would be worth treating to see if pt feels better with treatment. Left office number for call back.

## 2022-12-12 ENCOUNTER — Encounter: Payer: Self-pay | Admitting: *Deleted

## 2022-12-12 NOTE — Telephone Encounter (Signed)
I called the pt & LVM (ok per DPR), 3rd attempt, asking for call back to discuss pt's SS results and the next steps. I advised that mild OSA was found but Dr Frances Furbish does recommend treatment. Left office number for call back. I also sent a letter since we have attempted to reach pt 3x.

## 2022-12-13 ENCOUNTER — Ambulatory Visit: Payer: BC Managed Care – PPO | Admitting: Nurse Practitioner
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
## 2023-01-19 ENCOUNTER — Ambulatory Visit: Payer: BC Managed Care – PPO | Admitting: Nurse Practitioner

## 2023-01-19 ENCOUNTER — Encounter: Payer: Self-pay | Admitting: Nurse Practitioner

## 2023-01-19 VITALS — BP 123/85 | HR 65 | Temp 98.4°F | Ht 62.0 in | Wt 103.6 lb

## 2023-01-19 DIAGNOSIS — Z1231 Encounter for screening mammogram for malignant neoplasm of breast: Secondary | ICD-10-CM | POA: Diagnosis not present

## 2023-01-19 DIAGNOSIS — Z0001 Encounter for general adult medical examination with abnormal findings: Secondary | ICD-10-CM | POA: Diagnosis not present

## 2023-01-19 DIAGNOSIS — Z122 Encounter for screening for malignant neoplasm of respiratory organs: Secondary | ICD-10-CM

## 2023-01-19 DIAGNOSIS — Z720 Tobacco use: Secondary | ICD-10-CM | POA: Insufficient documentation

## 2023-01-19 DIAGNOSIS — Z23 Encounter for immunization: Secondary | ICD-10-CM | POA: Diagnosis not present

## 2023-01-19 DIAGNOSIS — G43009 Migraine without aura, not intractable, without status migrainosus: Secondary | ICD-10-CM

## 2023-01-19 DIAGNOSIS — Z800 Family history of malignant neoplasm of digestive organs: Secondary | ICD-10-CM

## 2023-01-19 DIAGNOSIS — F17210 Nicotine dependence, cigarettes, uncomplicated: Secondary | ICD-10-CM | POA: Diagnosis not present

## 2023-01-19 DIAGNOSIS — F330 Major depressive disorder, recurrent, mild: Secondary | ICD-10-CM

## 2023-01-19 DIAGNOSIS — Z716 Tobacco abuse counseling: Secondary | ICD-10-CM | POA: Diagnosis not present

## 2023-01-19 DIAGNOSIS — Z1211 Encounter for screening for malignant neoplasm of colon: Secondary | ICD-10-CM

## 2023-01-19 MED ORDER — BUPROPION HCL ER (SR) 100 MG PO TB12: 100.0000 mg | ORAL_TABLET | Freq: Two times a day (BID) | ORAL | 1 refills | Status: DC

## 2023-01-19 MED ORDER — RIZATRIPTAN BENZOATE 5 MG PO TABS: 5.0000 mg | ORAL_TABLET | ORAL | 1 refills | Status: DC | PRN

## 2023-01-20 LAB — CBC WITH DIFFERENTIAL/PLATELET
Basophils Absolute: 0.1 10*3/uL (ref 0.0–0.2)
Basos: 1 %
EOS (ABSOLUTE): 0 10*3/uL (ref 0.0–0.4)
Eos: 1 %
Hematocrit: 40.1 % (ref 34.0–46.6)
Hemoglobin: 13.2 g/dL (ref 11.1–15.9)
Immature Grans (Abs): 0 10*3/uL (ref 0.0–0.1)
Immature Granulocytes: 0 %
Lymphocytes Absolute: 2.3 10*3/uL (ref 0.7–3.1)
Lymphs: 31 %
MCH: 32.2 pg (ref 26.6–33.0)
MCHC: 32.9 g/dL (ref 31.5–35.7)
MCV: 98 fL — ABNORMAL HIGH (ref 79–97)
Monocytes Absolute: 0.4 10*3/uL (ref 0.1–0.9)
Monocytes: 6 %
Neutrophils Absolute: 4.5 10*3/uL (ref 1.4–7.0)
Neutrophils: 61 %
Platelets: 289 10*3/uL (ref 150–450)
RBC: 4.1 x10E6/uL (ref 3.77–5.28)
RDW: 13.1 % (ref 11.7–15.4)
WBC: 7.3 10*3/uL (ref 3.4–10.8)

## 2023-01-20 LAB — THYROID PANEL WITH TSH
Free Thyroxine Index: 1.6 (ref 1.2–4.9)
T3 Uptake Ratio: 25 % (ref 24–39)
T4, Total: 6.4 ug/dL (ref 4.5–12.0)
TSH: 1.67 u[IU]/mL (ref 0.450–4.500)

## 2023-01-20 LAB — CMP14+EGFR
ALT: 20 [IU]/L (ref 0–32)
AST: 18 [IU]/L (ref 0–40)
Albumin: 4 g/dL (ref 3.8–4.9)
Alkaline Phosphatase: 90 [IU]/L (ref 44–121)
BUN/Creatinine Ratio: 26 — ABNORMAL HIGH (ref 9–23)
BUN: 17 mg/dL (ref 6–24)
Bilirubin Total: <0.2 mg/dL (ref 0.0–1.2)
CO2: 24 mmol/L (ref 20–29)
Calcium: 9.8 mg/dL (ref 8.7–10.2)
Chloride: 103 mmol/L (ref 96–106)
Creatinine, Ser: 0.65 mg/dL (ref 0.57–1.00)
Globulin, Total: 2.2 g/dL (ref 1.5–4.5)
Glucose: 92 mg/dL (ref 70–99)
Potassium: 4.4 mmol/L (ref 3.5–5.2)
Sodium: 140 mmol/L (ref 134–144)
Total Protein: 6.2 g/dL (ref 6.0–8.5)
eGFR: 104 mL/min/{1.73_m2} (ref >59)

## 2023-01-20 LAB — HEPB+HEPC+HIV PANEL
HIV Screen 4th Generation wRfx: NONREACTIVE
Hep B C IgM: NEGATIVE
Hep B Core Total Ab: NEGATIVE
Hep B E Ab: NONREACTIVE
Hep B E Ag: NEGATIVE
Hep B Surface Ab, Qual: NONREACTIVE
Hep C Virus Ab: NONREACTIVE
Hepatitis B Surface Ag: NEGATIVE

## 2023-01-20 LAB — LIPID PANEL
Chol/HDL Ratio: 3.2 {ratio} (ref 0.0–4.4)
Cholesterol, Total: 167 mg/dL (ref 100–199)
HDL: 52 mg/dL (ref >39)
LDL Chol Calc (NIH): 104 mg/dL — ABNORMAL HIGH (ref 0–99)
Triglycerides: 53 mg/dL (ref 0–149)
VLDL Cholesterol Cal: 11 mg/dL (ref 5–40)

## 2023-01-26 ENCOUNTER — Encounter (INDEPENDENT_AMBULATORY_CARE_PROVIDER_SITE_OTHER): Payer: Self-pay | Admitting: *Deleted

## 2023-02-15 ENCOUNTER — Emergency Department (HOSPITAL_BASED_OUTPATIENT_CLINIC_OR_DEPARTMENT_OTHER): Payer: BC Managed Care – PPO

## 2023-02-15 ENCOUNTER — Other Ambulatory Visit: Payer: Self-pay

## 2023-02-15 ENCOUNTER — Emergency Department (HOSPITAL_BASED_OUTPATIENT_CLINIC_OR_DEPARTMENT_OTHER)
Admission: EM | Admit: 2023-02-15 | Discharge: 2023-02-15 | Disposition: A | Discharge status: Home / Self Care | Payer: BC Managed Care – PPO | Attending: Emergency Medicine | Admitting: Emergency Medicine

## 2023-02-15 ENCOUNTER — Ambulatory Visit: Payer: Self-pay | Admitting: Nurse Practitioner

## 2023-02-15 ENCOUNTER — Encounter (HOSPITAL_BASED_OUTPATIENT_CLINIC_OR_DEPARTMENT_OTHER): Payer: Self-pay | Admitting: Emergency Medicine

## 2023-02-15 DIAGNOSIS — D72829 Elevated white blood cell count, unspecified: Secondary | ICD-10-CM | POA: Diagnosis not present

## 2023-02-15 DIAGNOSIS — Z20822 Contact with and (suspected) exposure to covid-19: Secondary | ICD-10-CM | POA: Diagnosis not present

## 2023-02-15 DIAGNOSIS — Z87891 Personal history of nicotine dependence: Secondary | ICD-10-CM | POA: Diagnosis not present

## 2023-02-15 DIAGNOSIS — Z79899 Other long term (current) drug therapy: Secondary | ICD-10-CM | POA: Insufficient documentation

## 2023-02-15 DIAGNOSIS — J432 Centrilobular emphysema: Secondary | ICD-10-CM | POA: Diagnosis not present

## 2023-02-15 DIAGNOSIS — E876 Hypokalemia: Secondary | ICD-10-CM | POA: Diagnosis not present

## 2023-02-15 DIAGNOSIS — J189 Pneumonia, unspecified organism: Secondary | ICD-10-CM | POA: Diagnosis not present

## 2023-02-15 DIAGNOSIS — R0602 Shortness of breath: Secondary | ICD-10-CM | POA: Diagnosis not present

## 2023-02-15 DIAGNOSIS — J181 Lobar pneumonia, unspecified organism: Secondary | ICD-10-CM | POA: Diagnosis not present

## 2023-02-15 DIAGNOSIS — Z7982 Long term (current) use of aspirin: Secondary | ICD-10-CM | POA: Insufficient documentation

## 2023-02-15 DIAGNOSIS — R079 Chest pain, unspecified: Secondary | ICD-10-CM | POA: Diagnosis not present

## 2023-02-15 DIAGNOSIS — R059 Cough, unspecified: Secondary | ICD-10-CM | POA: Diagnosis not present

## 2023-02-15 DIAGNOSIS — R0789 Other chest pain: Secondary | ICD-10-CM | POA: Diagnosis not present

## 2023-02-15 LAB — BASIC METABOLIC PANEL
Anion gap: 10 (ref 5–15)
BUN: 12 mg/dL (ref 6–20)
CO2: 24 mmol/L (ref 22–32)
Calcium: 9.4 mg/dL (ref 8.9–10.3)
Chloride: 103 mmol/L (ref 98–111)
Creatinine, Ser: 0.75 mg/dL (ref 0.44–1.00)
GFR, Estimated: >60 mL/min (ref >60)
Glucose, Bld: 155 mg/dL — ABNORMAL HIGH (ref 70–99)
Potassium: 3.2 mmol/L — ABNORMAL LOW (ref 3.5–5.1)
Sodium: 137 mmol/L (ref 135–145)

## 2023-02-15 LAB — CBC
HCT: 40.7 % (ref 36.0–46.0)
Hemoglobin: 14 g/dL (ref 12.0–15.0)
MCH: 32.3 pg (ref 26.0–34.0)
MCHC: 34.4 g/dL (ref 30.0–36.0)
MCV: 94 fL (ref 80.0–100.0)
Platelets: 255 10*3/uL (ref 150–400)
RBC: 4.33 MIL/uL (ref 3.87–5.11)
RDW: 13.3 % (ref 11.5–15.5)
WBC: 10.6 10*3/uL — ABNORMAL HIGH (ref 4.0–10.5)
nRBC: 0 % (ref 0.0–0.2)

## 2023-02-15 LAB — RESP PANEL BY RT-PCR (RSV, FLU A&B, COVID)  RVPGX2
Influenza A by PCR: NEGATIVE
Influenza B by PCR: NEGATIVE
Resp Syncytial Virus by PCR: NEGATIVE
SARS Coronavirus 2 by RT PCR: NEGATIVE

## 2023-02-15 LAB — TROPONIN I (HIGH SENSITIVITY)
Troponin I (High Sensitivity): <2 ng/L (ref <18)
Troponin I (High Sensitivity): 2 ng/L (ref <18)

## 2023-02-15 LAB — D-DIMER, QUANTITATIVE: D-Dimer, Quant: 0.68 ug{FEU}/mL — ABNORMAL HIGH (ref 0.00–0.50)

## 2023-02-15 MED ORDER — AMOXICILLIN-POT CLAVULANATE 875-125 MG PO TABS: 1.0000 | ORAL_TABLET | Freq: Two times a day (BID) | ORAL | 0 refills | Status: AC

## 2023-02-15 MED ORDER — AZITHROMYCIN 250 MG PO TABS: ORAL_TABLET | ORAL | 0 refills | Status: AC

## 2023-02-15 MED ORDER — IOHEXOL 350 MG/ML SOLN
75.0000 mL | Freq: Once | INTRAVENOUS | Status: AC | PRN
  Administered 2023-02-15: 75 mL via INTRAVENOUS

## 2023-02-15 NOTE — ED Triage Notes (Signed)
Right side chest pain started this morning , shortness of breath , cough x 2 weeks , chills .

## 2023-02-15 NOTE — ED Notes (Signed)
Cannot discharge, registration in chart.

## 2023-02-15 NOTE — ED Provider Notes (Signed)
[place] [facility] AT [facility] Provider Note   CSN: [id number] Arrival date & time: 02/15/23  1124     History  Chief Complaint  Patient presents with   Chest Pain    Savannah [name] is a 58 y.o. female.  Patient with past history significant for smoking history presents the emergency department concerns of chest pain.  She reports that she has been experiencing right-sided chest pain starting this morning.  Some associated shortness of breath as well as cough for about 2 weeks.  Some subjective fever and chills.  Denies hemoptysis but does report coughing up dark discolored phlegm.  Not on any blood thinners.  No prior history of PE or DVT.   Chest Pain      Home Medications Prior to Admission medications   Medication Sig Start Date End Date Taking? Authorizing Provider  amoxicillin-clavulanate (AUGMENTIN) 875-125 MG tablet Take 1 tablet by mouth every 12 (twelve) hours for 5 days. 02/15/23 02/20/23 Yes Smitty Knudsen, PA-C  azithromycin (ZITHROMAX) 250 MG tablet Take 2 tablets (500 mg total) by mouth daily for 1 day, THEN 1 tablet (250 mg total) daily for 4 days. Take first 2 tablets together, then 1 every day until finished.. 02/15/23 02/20/23 Yes Smitty Knudsen, PA-C  acetaminophen (TYLENOL) 500 MG tablet Take 500 mg by mouth every 6 (six) hours as needed.    [provider]  Aspirin-Acetaminophen-Caffeine (GOODY HEADACHE PO) Take by mouth. Takes 1-2 daily for headaches    [provider]  buPROPion ER (WELLBUTRIN SR) 100 MG 12 hr tablet Take 1 tablet (100 mg total) by mouth 2 (two) times daily. 01/19/23   St Vena Austria, NP  Multiple Vitamin (MULTIVITAMIN) tablet Take 1 tablet by mouth daily.    [provider]  rizatriptan (MAXALT) 5 MG tablet Take 1 tablet (5 mg total) by mouth as needed for migraine. May repeat in 2 hours if needed 01/19/23   Martina Sinner, NP      Allergies    Patient has no known  allergies.    Review of Systems   Review of Systems  Cardiovascular:  Positive for chest pain.  All other systems reviewed and are negative.   Physical Exam Updated Vital Signs BP 132/73   Pulse 83   Temp 98.4 F (36.9 C) (Oral)   Resp 19   Wt 47.6 kg   SpO2 100%   BMI 19.20 kg/m  Physical Exam Vitals and nursing note reviewed.  Constitutional:      General: She is not in acute distress.    Appearance: She is well-developed.  HENT:     Head: Normocephalic and atraumatic.  Eyes:     Conjunctiva/sclera: Conjunctivae normal.  Cardiovascular:     Rate and Rhythm: Normal rate and regular rhythm.     Heart sounds: No murmur heard. Pulmonary:     Effort: Pulmonary effort is normal. No respiratory distress.     Breath sounds: Normal breath sounds. No decreased breath sounds, wheezing, rhonchi or rales.  Abdominal:     Palpations: Abdomen is soft.     Tenderness: There is no abdominal tenderness.  Musculoskeletal:        General: No swelling.     Cervical back: Neck supple.  Skin:    General: Skin is warm and dry.     Capillary Refill: Capillary refill takes less than 2 seconds.  Neurological:     Mental Status: She is alert.  Psychiatric:  Mood and Affect: Mood normal.     ED Results / Procedures / Treatments   Labs (all labs ordered are listed, but only abnormal results are displayed) Labs Reviewed  BASIC METABOLIC PANEL - Abnormal; Notable for the following components:      Result Value   Potassium 3.2 (*)    Glucose, Bld 155 (*)    All other components within normal limits  CBC - Abnormal; Notable for the following components:   WBC 10.6 (*)    All other components within normal limits  D-DIMER, QUANTITATIVE (NOT AT [facility]) - Abnormal; Notable for the following components:   D-Dimer, Quant 0.68 (*)    All other components within normal limits  RESP PANEL BY RT-PCR (RSV, FLU A&B, COVID)  [id number]  TROPONIN I (HIGH SENSITIVITY)  TROPONIN I (HIGH  SENSITIVITY)    EKG EKG Interpretation Date/Time:  Wednesday February 15 2023 11:35:21 EST Ventricular Rate:  86 PR Interval:  129 QRS Duration:  96 QT Interval:  357 QTC Calculation: 427 R Axis:   89  Text Interpretation: Sinus rhythm Right atrial enlargement Borderline repolarization abnormality Confirmed by Virgina Norfolk [phone]) on 02/15/2023 6:09:42 PM  Radiology CT Angio Chest PE W and/or Wo Contrast Result Date: 02/15/2023 CLINICAL DATA:  Right-sided chest pain with cough and shortness of breath. EXAM: CT ANGIOGRAPHY CHEST WITH CONTRAST TECHNIQUE: Multidetector CT imaging of the chest was performed using the standard protocol during bolus administration of intravenous contrast. Multiplanar CT image reconstructions and MIPs were obtained to evaluate the vascular anatomy. RADIATION DOSE REDUCTION: This exam was performed according to the departmental dose-optimization program which includes automated exposure control, adjustment of the mA and/or kV according to patient size and/or use of iterative reconstruction technique. CONTRAST:  75mL OMNIPAQUE IOHEXOL 350 MG/ML SOLN COMPARISON:  Chest x-ray from same day. FINDINGS: Cardiovascular: Satisfactory opacification of the pulmonary arteries to the segmental level. No evidence of pulmonary embolism. Normal heart size. No pericardial effusion. No thoracic aortic aneurysm. Mediastinum/Nodes: No enlarged mediastinal, hilar, or axillary lymph nodes. Thyroid gland, trachea, and esophagus demonstrate no significant findings. Lungs/Pleura: Mild centrilobular emphysema. Small consolidative peribronchovascular opacity in the medial right middle lobe. Remaining lungs are clear. No pleural effusion or pneumothorax. Upper Abdomen: No acute abnormality. Musculoskeletal: Bilateral breast augmentation. No acute or significant osseous findings. Review of the MIP images confirms the above findings. IMPRESSION: 1. No evidence of pulmonary embolism. 2. Right middle  lobe pneumonia. 3.  Emphysema (ICD10-J43.9). Electronically Signed   By: Obie Dredge M.D.   On: 02/15/2023 18:01   DG Chest 2 View Result Date: 02/15/2023 CLINICAL DATA:  Chest pain.  Shortness of breath. EXAM: CHEST - 2 VIEW COMPARISON:  07/14/2020. FINDINGS: Bilateral lung fields are clear. Bilateral costophrenic angles are clear. Normal cardio-mediastinal silhouette. No acute osseous abnormalities. The soft tissues are within normal limits. IMPRESSION: No active cardiopulmonary disease. Electronically Signed   By: Jules Schick M.D.   On: 02/15/2023 14:14    Procedures Procedures    Medications Ordered in ED Medications  iohexol (OMNIPAQUE) 350 MG/ML injection 75 mL (75 mLs Intravenous Contrast Given 02/15/23 1606)    ED Course/ Medical Decision Making/ A&P                                 Medical Decision Making Amount and/or Complexity of Data Reviewed Labs: ordered. Radiology: ordered.  Risk Prescription drug management.   This patient presents to the  ED for concern of chest pain. Differential diagnosis includes ACS, PE, pneumonia, bronchitis, viral URI   Lab Tests:  I Ordered, and personally interpreted labs.  The pertinent results include: CBC with mild leukocytosis of 10.6, BMP with hypokalemia 3.2, troponin less than 2, respiratory panel negative for COVID-19, influenza, RSV, D-dimer is elevated at 0.68, troponin 2   Imaging Studies ordered:  I ordered imaging studies including chest x-ray, CT angio chest I independently visualized and interpreted imaging which showed no active cardiopulmonary disease, CT angio chest negative for PE but does have signs of right middle lobe pneumonia I agree with the radiologist interpretation   Problem List / ED Course:  Patient with significant smoking history presents to the ED with concerns of a chest pain. States that she began experience right-sided chest pain about 2 weeks ago primarily with coughing but not progressing  to pain present most of the time.  Denies any obvious shortness of breath but does report that she had some dark-colored phlegm when clearing her chest.  No prior history of PE or DVT.  Not on blood thinners.  Endorses about a 40-year pack history.  No prior lung cancer evaluation. On exam, patient has right-sided rib tenderness to palpation.  No obvious bony deformity, bruising, or swelling.  No erythema, or rash present.  Lung and heart sounds unremarkable.  Will obtain lab workup and add on D-dimer for assessment of possible PE given elevated risk with smoking history. D-dimer elevated.  Will proceed with CT angio chest. Labs at this time are is unremarkable with only mild leukocytosis at 10.6 and BMP shows mild hypokalemia 3.2.  Currently asymptomatic so no acute indication for repletion.  Respiratory panel negative.  Troponin normal at 2. CT angio chest is negative for signs of PE but does show signs of right middle lobe pneumonia. Discussion with patient, decided on antibiotic regimen given increased risk with smoking history.  Will prescribe Augmentin and azithromycin to take for the next 5 days.  Advised patient to follow-up with PCP for repeat evaluation to for symptom resolution.  No other acute focal concerns at this time patient discharged home in stable condition.   Final Clinical Impression(s) / ED Diagnoses Final diagnoses:  Pneumonia of right middle lobe due to infectious organism  Hypokalemia    Rx / DC Orders ED Discharge Orders          Ordered    amoxicillin-clavulanate (AUGMENTIN) 875-125 MG tablet  Every 12 hours        02/15/23 1817    azithromycin (ZITHROMAX) 250 MG tablet  Daily        02/15/23 1817              Smitty Knudsen, PA-C 02/15/23 1821    Royanne Foots, DO 02/20/23 [phone]

## 2023-02-15 NOTE — Discharge Instructions (Signed)
You are seen the emergency department today with concerns of chest pain.  Your labs were thankfully reassuring although your D-dimer level was elevated.  We had CT imaging performed to evaluate for any possible clots in your lungs which was thankfully negative.  It appears that you do have right middle lobe pneumonia which at this time is assumed to be community-acquired.  For this reason, you are started on 2 antibiotics given your smoking history which includes Augmentin and azithromycin.  Please take this as prescribed.  For any new or worsening symptoms, return the emergency department.  Otherwise please follow-up with your primary care provider.

## 2023-02-15 NOTE — ED Notes (Signed)

## 2023-02-15 NOTE — Telephone Encounter (Addendum)
Copied from CRM [id number]. Topic: Clinical - Red Word Triage >> Feb 15, 2023 10:23 AM Fuller Mandril wrote: Red Word that prompted transfer to Nurse Triage: Chest pain, shortness of breath went to urgent care x-ray machine down  Chief Complaint: Chest pain Symptoms: SOB, sweating, pain radiation Frequency: This morning Pertinent Negatives: Patient denies relief Disposition: [x] ED /[] Urgent Care (no appt availability in office) / [] Appointment(In office/virtual)/ []  [facility]/ [] Home Care/ [] Refused Recommended Disposition /[]  [facility]/ []  Follow-up with PCP Additional Notes: Patient called in to report severe chest pain that suddenly onset early this morning. Patient stated the pain has increased throughout the morning and is now constant. Patient reported that the pain feels like a "squeezing/stabbing" sensation. Patient stated the pain is in the right side of her chest and radiates to her right arm and back. Patient reported that she is having SOB upon exertion. Patient stated that she is sweating profusely. Patient is a current smoker. This RN advised patient to go to the ED immediately. Patient complied and stated that she would head to the ED. This RN advised patient to call back if anything changes.   Reason for Disposition  SEVERE chest pain  Answer Assessment - Initial Assessment Questions 1. LOCATION: "Where does it hurt?"       Right side of chest near breast, now moving towards center 2. RADIATION: "Does the pain go anywhere else?" (e.g., into neck, jaw, arms, back)     States pain moved to right arm and back while talking on the phone for an extended period of time 3. ONSET: "When did the chest pain begin?" (Minutes, hours or days)      Around 6 am, worsened around 8 am 4. PATTERN: "Does the pain come and go, or has it been constant since it started?"  "Does it get worse with exertion?"      States pain is constant right now 5. DURATION: "How long does it  last" (e.g., seconds, minutes, hours)     States she is no longer getting relief 6. SEVERITY: "How bad is the pain?"  (e.g., Scale 1-10; mild, moderate, or severe)    - MILD (1-3): doesn't interfere with normal activities     - MODERATE (4-7): interferes with normal activities or awakens from sleep    - SEVERE (8-10): excruciating pain, unable to do any normal activities       States pain is at least an 8, states pain feels like "squeezing/stabbing" 7. CARDIAC RISK FACTORS: "Do you have any history of heart problems or risk factors for heart disease?" (e.g., angina, prior heart attack; diabetes, high blood pressure, high cholesterol, smoker, or strong family history of heart disease)     Denies 8. PULMONARY RISK FACTORS: "Do you have any history of lung disease?"  (e.g., blood clots in lung, asthma, emphysema, birth control pills)     Denies 10. OTHER SYMPTOMS: "Do you have any other symptoms?" (e.g., dizziness, nausea, vomiting, sweating, fever, difficulty breathing, cough)       SOB when talking or taking deep breathing, sweating profusely, productive cough, denies dizziness, denies nausea  Protocols used: Chest Pain-A-AH

## 2023-02-20 ENCOUNTER — Telehealth (INDEPENDENT_AMBULATORY_CARE_PROVIDER_SITE_OTHER): Payer: Self-pay | Admitting: Gastroenterology

## 2023-02-20 NOTE — Telephone Encounter (Signed)
 Ok to schedule.  Room 1/2  Thanks,  [name], MD Gastroenterology and Hepatology [facility] Gastroenterology

## 2023-02-20 NOTE — Telephone Encounter (Signed)
Who is your primary care physician: [facility]  Reasons for the colonoscopy:   Have you had a colonoscopy before?  Yes 2000  Do you have family history of colon cancer? Yes my father  Previous colonoscopy with polyps removed? no  Do you have a history colorectal cancer?   no  Are you diabetic? If yes, Type 1 or Type 2?    no  Do you have a prosthetic or mechanical heart valve? no  Do you have a pacemaker/defibrillator?   no  Have you had endocarditis/atrial fibrillation? no  Have you had joint replacement within the last 12 months?  no  Do you tend to be constipated or have to use laxatives? no  Do you have any history of drugs or alchohol?  no  Do you use supplemental oxygen?  no  Have you had a stroke or heart attack within the last 6 months? no  Do you take weight loss medication?  no  For female patients: have you had a hysterectomy?  yes                                     are you post menopausal?       yes                                            do you still have your menstrual cycle? no      Do you take any blood-thinning medications such as: (aspirin, warfarin, Plavix, Aggrenox)  no  If yes we need the name, milligram, dosage and who is prescribing doctor  Current Outpatient Medications on File Prior to Visit  Medication Sig Dispense Refill   acetaminophen (TYLENOL) 500 MG tablet Take 500 mg by mouth every 6 (six) hours as needed.     Aspirin-Acetaminophen-Caffeine (GOODY HEADACHE PO) Take by mouth. Takes 1-2 daily for headaches     buPROPion ER (WELLBUTRIN SR) 100 MG 12 hr tablet Take 1 tablet (100 mg total) by mouth 2 (two) times daily. 180 tablet 1   Magnesium Gluconate 500 (27 Mg) MG TABS Take by mouth.     Multiple Vitamin (MULTIVITAMIN) tablet Take 1 tablet by mouth daily.     raNITIdine HCl (ZANTAC PO) Take by mouth.     rizatriptan (MAXALT) 5 MG tablet Take 1 tablet (5 mg total) by mouth as needed for migraine. May repeat in 2 hours if needed 10 tablet 1    amoxicillin-clavulanate (AUGMENTIN) 875-125 MG tablet Take 1 tablet by mouth every 12 (twelve) hours for 5 days. (Patient not taking: Reported on 02/20/2023) 10 tablet 0   azithromycin (ZITHROMAX) 250 MG tablet Take 2 tablets (500 mg total) by mouth daily for 1 day, THEN 1 tablet (250 mg total) daily for 4 days. Take first 2 tablets together, then 1 every day until finished.. (Patient not taking: Reported on 02/20/2023) 6 tablet 0   No current facility-administered medications on file prior to visit.    No Known Allergies   Pharmacy: [facility] Name: [employer]  Best number where you can be reached: [phone]

## 2023-02-21 ENCOUNTER — Other Ambulatory Visit (HOSPITAL_COMMUNITY): Payer: Self-pay | Admitting: Nurse Practitioner

## 2023-02-21 DIAGNOSIS — Z1231 Encounter for screening mammogram for malignant neoplasm of breast: Secondary | ICD-10-CM

## 2023-02-21 NOTE — Telephone Encounter (Signed)
 Left message to return call

## 2023-02-21 NOTE — Telephone Encounter (Signed)
Pt returned call. Unable to book case at this time due to another user at Radiology being in chart

## 2023-02-22 ENCOUNTER — Encounter (INDEPENDENT_AMBULATORY_CARE_PROVIDER_SITE_OTHER): Payer: Self-pay | Admitting: *Deleted

## 2023-02-22 MED ORDER — PEG 3350-KCL-NA BICARB-NACL 420 G PO SOLR: 4000.0000 mL | Freq: Once | ORAL | 0 refills | Status: AC

## 2023-02-22 NOTE — Telephone Encounter (Signed)
Instructions will be mailed to pt. Prep sent to pharmacy. No PA needed per [employer].

## 2023-02-22 NOTE — Addendum Note (Signed)
Addended by: Marlowe Shores on: 02/22/2023 07:47 AM   Modules accepted: Orders

## 2023-02-22 NOTE — Telephone Encounter (Signed)
 Referral completed, [facility] apt letter sent to PCP

## 2023-03-03 ENCOUNTER — Institutional Professional Consult (permissible substitution): Payer: BC Managed Care – PPO | Admitting: Internal Medicine

## 2023-03-08 ENCOUNTER — Ambulatory Visit (HOSPITAL_COMMUNITY)
Admission: RE | Admit: 2023-03-08 | Discharge: 2023-03-08 | Disposition: A | Discharge status: Home / Self Care | Payer: BC Managed Care – PPO | Source: Ambulatory Visit | Attending: Nurse Practitioner | Admitting: Nurse Practitioner

## 2023-03-08 DIAGNOSIS — Z1231 Encounter for screening mammogram for malignant neoplasm of breast: Secondary | ICD-10-CM | POA: Insufficient documentation

## 2023-03-14 ENCOUNTER — Ambulatory Visit (HOSPITAL_COMMUNITY): Admitting: Anesthesiology

## 2023-03-14 ENCOUNTER — Encounter (HOSPITAL_COMMUNITY)
Admission: RE | Disposition: A | Discharge status: Home / Self Care | Payer: Self-pay | Source: Ambulatory Visit | Attending: Gastroenterology

## 2023-03-14 ENCOUNTER — Encounter (HOSPITAL_COMMUNITY): Payer: Self-pay | Admitting: Gastroenterology

## 2023-03-14 ENCOUNTER — Other Ambulatory Visit: Payer: Self-pay

## 2023-03-14 ENCOUNTER — Ambulatory Visit (HOSPITAL_COMMUNITY)
Admission: RE | Admit: 2023-03-14 | Discharge: 2023-03-14 | Disposition: A | Discharge status: Home / Self Care | Payer: BC Managed Care – PPO | Source: Ambulatory Visit | Attending: Gastroenterology | Admitting: Gastroenterology

## 2023-03-14 DIAGNOSIS — D121 Benign neoplasm of appendix: Secondary | ICD-10-CM | POA: Insufficient documentation

## 2023-03-14 DIAGNOSIS — D125 Benign neoplasm of sigmoid colon: Secondary | ICD-10-CM

## 2023-03-14 DIAGNOSIS — Z1211 Encounter for screening for malignant neoplasm of colon: Secondary | ICD-10-CM | POA: Insufficient documentation

## 2023-03-14 DIAGNOSIS — K644 Residual hemorrhoidal skin tags: Secondary | ICD-10-CM | POA: Insufficient documentation

## 2023-03-14 DIAGNOSIS — Z79899 Other long term (current) drug therapy: Secondary | ICD-10-CM | POA: Diagnosis not present

## 2023-03-14 DIAGNOSIS — D123 Benign neoplasm of transverse colon: Secondary | ICD-10-CM | POA: Insufficient documentation

## 2023-03-14 DIAGNOSIS — K648 Other hemorrhoids: Secondary | ICD-10-CM | POA: Diagnosis not present

## 2023-03-14 DIAGNOSIS — K5730 Diverticulosis of large intestine without perforation or abscess without bleeding: Secondary | ICD-10-CM | POA: Insufficient documentation

## 2023-03-14 DIAGNOSIS — Z800 Family history of malignant neoplasm of digestive organs: Secondary | ICD-10-CM | POA: Insufficient documentation

## 2023-03-14 DIAGNOSIS — K635 Polyp of colon: Secondary | ICD-10-CM | POA: Diagnosis not present

## 2023-03-14 DIAGNOSIS — D127 Benign neoplasm of rectosigmoid junction: Secondary | ICD-10-CM | POA: Diagnosis not present

## 2023-03-14 DIAGNOSIS — Z7982 Long term (current) use of aspirin: Secondary | ICD-10-CM | POA: Insufficient documentation

## 2023-03-14 DIAGNOSIS — F17290 Nicotine dependence, other tobacco product, uncomplicated: Secondary | ICD-10-CM | POA: Insufficient documentation

## 2023-03-14 DIAGNOSIS — F32A Depression, unspecified: Secondary | ICD-10-CM | POA: Diagnosis not present

## 2023-03-14 DIAGNOSIS — D122 Benign neoplasm of ascending colon: Secondary | ICD-10-CM | POA: Diagnosis not present

## 2023-03-14 HISTORY — PX: COLONOSCOPY WITH PROPOFOL: SHX5780

## 2023-03-14 HISTORY — PX: HEMOSTASIS CLIP PLACEMENT: SHX6857

## 2023-03-14 HISTORY — PX: POLYPECTOMY: SHX5525

## 2023-03-14 HISTORY — PX: SUBMUCOSAL INJECTION: SHX5543

## 2023-03-14 LAB — HM COLONOSCOPY

## 2023-03-14 SURGERY — COLONOSCOPY WITH PROPOFOL
Anesthesia: General

## 2023-03-14 MED ORDER — SODIUM CHLORIDE (PF) 0.9 % IJ SOLN
PREFILLED_SYRINGE | INTRAMUSCULAR | Status: DC | PRN
  Administered 2023-03-14: 1 mL

## 2023-03-14 MED ORDER — LACTATED RINGERS IV SOLN: INTRAVENOUS | Status: DC

## 2023-03-14 MED ORDER — LIDOCAINE HCL (CARDIAC) PF 100 MG/5ML IV SOSY
PREFILLED_SYRINGE | INTRAVENOUS | Status: DC | PRN
  Administered 2023-03-14: 60 mg via INTRAVENOUS

## 2023-03-14 MED ORDER — SPOT INK MARKER SYRINGE KIT
PACK | SUBMUCOSAL | Status: DC | PRN
  Administered 2023-03-14: 1 mL via SUBMUCOSAL

## 2023-03-14 MED ORDER — LACTATED RINGERS IV SOLN: INTRAVENOUS | Status: DC | PRN

## 2023-03-14 MED ORDER — PROPOFOL 10 MG/ML IV BOLUS
INTRAVENOUS | Status: DC | PRN
  Administered 2023-03-14: 80 mg via INTRAVENOUS
  Administered 2023-03-14: 125 ug/kg/min via INTRAVENOUS
  Administered 2023-03-14: 50 mg via INTRAVENOUS

## 2023-03-14 NOTE — Anesthesia Preprocedure Evaluation (Signed)
 Anesthesia Evaluation  Patient identified by MRN, date of birth, ID band Patient awake    Reviewed: reviewed documented beta blocker date and time   Airway Mallampati: I  TM Distance: >3 FB Neck ROM: Full    Dental no notable dental hx. (+) Teeth Intact   Pulmonary Patient abstained from smoking., former smoker   Pulmonary exam normal breath sounds clear to auscultation       Cardiovascular Normal cardiovascular exam Rhythm:Regular Rate:Normal     Neuro/Psych    Depression       GI/Hepatic   Endo/Other    Renal/GU      Musculoskeletal   Abdominal Normal abdominal exam  (+)   Peds  Hematology   Anesthesia Other Findings Migraine  Reproductive/Obstetrics                             Anesthesia Physical Anesthesia Plan  ASA: 2  Anesthesia Plan: General   Post-op Pain Management:    Induction:   PONV Risk Score and Plan: 0  Airway Management Planned: Natural Airway and Nasal Cannula  Additional Equipment:   Intra-op Plan:   Post-operative Plan:   Informed Consent:   Plan Discussed with:   Anesthesia Plan Comments:        Anesthesia Quick Evaluation
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
## 2023-03-14 NOTE — Discharge Instructions (Signed)

## 2023-03-14 NOTE — Op Note (Signed)
 [facility] Patient Name: Savannah [name] Procedure Date: 03/14/2023 9:59 AM MRN: [id number] Date of Birth: March 14, 1965 Attending MD: Sanjuan Dame , MD, [id number] CSN: [id number] Age: 58 Admit Type: Outpatient Procedure:                Colonoscopy Indications:              Screening in patient at increased risk: Colorectal                            cancer in father before age 72 Providers:                Sanjuan Dame, MD, Francoise Ceo RN, RN, Elinor Parkinson Referring MD:              Medicines:                Monitored Anesthesia Care Complications:            No immediate complications. Estimated Blood Loss:     Estimated blood loss was minimal. Procedure:                Pre-Anesthesia Assessment:                           - Prior to the procedure, a History and Physical                            was performed, and patient medications and                            allergies were reviewed. The patient's tolerance of                            previous anesthesia was also reviewed. The risks                            and benefits of the procedure and the sedation                            options and risks were discussed with the patient.                            All questions were answered, and informed consent                            was obtained. Prior Anticoagulants: The patient has                            taken no anticoagulant or antiplatelet agents. ASA                            Grade Assessment: II - A patient with mild systemic  disease. After reviewing the risks and benefits,                            the patient was deemed in satisfactory condition to                            undergo the procedure.                           After obtaining informed consent, the colonoscope                            was passed under direct vision. Throughout the                            procedure, the patient's  blood pressure, pulse, and                            oxygen saturations were monitored continuously. The                            [id number]-HQ190L ([id number]) scope was introduced through                            the anus and advanced to the the cecum, identified                            by appendiceal orifice and ileocecal valve. The                            colonoscopy was performed without difficulty. The                            patient tolerated the procedure well. The quality                            of the bowel preparation was evaluated using the                            BBPS [facility] Bowel Preparation Scale) with scores                            of: Right Colon = 2 (minor amount of residual                            staining, small fragments of stool and/or opaque                            liquid, but mucosa seen well), Transverse Colon = 2                            (minor amount of residual staining, small fragments  of stool and/or opaque liquid, but mucosa seen                            well) and Left Colon = 2 (minor amount of residual                            staining, small fragments of stool and/or opaque                            liquid, but mucosa seen well). The total BBPS score                            equals 6. The ileocecal valve, appendiceal orifice,                            and rectum were photographed. Scope In: 10:26:21 AM Scope Out: 11:20:05 AM Scope Withdrawal Time: 0 hours 46 minutes 1 second  Total Procedure Duration: 0 hours 53 minutes 44 seconds  Findings:      Five sessile polyps were found in the transverse colon, hepatic flexure,       ascending colon and appendiceal orifice. The polyps were 5 to 9 mm in       size. These polyps were removed with a cold snare. Resection and       retrieval were complete.      A 15 mm polyp was found in the recto-sigmoid colon. The polyp was       sessile. The polyp was removed  with a hot snare. Resection and retrieval       were complete.      A 25 mm polyp was found in the recto-sigmoid colon. The polyp was       sessile. Preparations were made for mucosal resection. Demarcation of       the lesion was performed with high-definition white light and narrow       band imaging to clearly identify the boundaries of the lesion. A 0.1       mg/mL solution of epinephrine was injected to raise the lesion. Snare       mucosal resection was performed. Resection and retrieval were complete.       Resected tissue margins were examined and clear of polyp tissue. To       close a defect after polypectomy, one hemostatic clip was successfully       placed (MR conditional). Clip manufacturer: [employer]. There was       no bleeding at the end of the procedure. Area was tattooed with an       injection of 1 mL of Spot (carbon black).      Scattered small-mouthed diverticula were found in the left colon.      Non-bleeding external and internal hemorrhoids were found during       retroflexion. The hemorrhoids were small. Impression:               - Five 5 to 9 mm polyps in the transverse colon, at                            the hepatic flexure, in the ascending colon and at  the appendiceal orifice, removed with a cold snare.                            Resected and retrieved.                           - One 15 mm polyp at the recto-sigmoid colon,                            removed with a hot snare. Resected and retrieved.                           - One 25 mm polyp at the recto-sigmoid colon,                            removed with mucosal resection. Resected and                            retrieved. Clip (MR conditional) was placed. Clip                            manufacturer: [employer]. Tattooed.                           - Diverticulosis in the left colon.                           - Non-bleeding external and internal hemorrhoids.                            - Mucosal resection was performed. Resection and                            retrieval were complete. Moderate Sedation:      Per Anesthesia Care Recommendation:           - Patient has a contact number available for                            emergencies. The signs and symptoms of potential                            delayed complications were discussed with the                            patient. Return to normal activities tomorrow.                            Written discharge instructions were provided to the                            patient.                           - High fiber diet.                           -  Continue present medications.                           - Await pathology results.                           - Repeat colonoscopy in 1 year for surveillance.                           - Return to primary care physician as previously                            scheduled. Procedure Code(s):        --- Professional ---                           [phone], Colonoscopy, flexible; with endoscopic                            mucosal resection                           [id number], 59, Colonoscopy, flexible; with removal of                            tumor(s), polyp(s), or other lesion(s) by snare                            technique Diagnosis Code(s):        --- Professional ---                           Z80.0, Family history of malignant neoplasm of                            digestive organs                           D12.3, Benign neoplasm of transverse colon (hepatic                            flexure or splenic flexure)                           D12.2, Benign neoplasm of ascending colon                           D12.1, Benign neoplasm of appendix                           D12.7, Benign neoplasm of rectosigmoid junction                           K64.8, Other hemorrhoids                           K57.30, Diverticulosis of large intestine without  perforation or abscess without bleeding CPT copyright 2022 American Medical Association. All rights reserved. The codes documented in this report are preliminary and upon coder review may  be revised to meet current compliance requirements. Sanjuan Dame, MD Sanjuan Dame, MD 03/14/2023 11:26:45 AM This report has been signed electronically. Number of Addenda: 0

## 2023-03-14 NOTE — Transfer of Care (Signed)
 Immediate Anesthesia Transfer of Care Note  Patient: Savannah [name]  Procedure(s) Performed: COLONOSCOPY WITH PROPOFOL POLYPECTOMY INJECTION, SUBMUCOSAL CONTROL OF HEMORRHAGE, GI TRACT, ENDOSCOPIC, BY CLIPPING OR OVERSEWING  Patient Location: PACU  Anesthesia Type:General  Level of Consciousness: awake, alert , and patient cooperative  Airway & Oxygen Therapy: Patient Spontanous Breathing  Post-op Assessment: Report given to RN, Post -op Vital signs reviewed and stable, and Patient moving all extremities X 4  Post vital signs: Reviewed and stable  Last Vitals:  Vitals Value Taken Time  BP 120/81 03/14/23 1124  Temp 36.7 C 03/14/23 1124  Pulse 85 03/14/23 1124  Resp 15 03/14/23 1124  SpO2 97 % 03/14/23 1124    Last Pain:  Vitals:   03/14/23 1124  TempSrc: Oral  PainSc: 0-No pain      Patients Stated Pain Goal: 4 (03/14/23 0931)  Complications: No notable events documented.

## 2023-03-15 ENCOUNTER — Encounter (HOSPITAL_COMMUNITY): Payer: Self-pay | Admitting: Gastroenterology

## 2023-03-15 ENCOUNTER — Encounter (INDEPENDENT_AMBULATORY_CARE_PROVIDER_SITE_OTHER): Payer: Self-pay | Admitting: *Deleted

## 2023-03-15 LAB — SURGICAL PATHOLOGY

## 2023-03-18 NOTE — Anesthesia Postprocedure Evaluation (Signed)
 Anesthesia Post Note  Patient: Savannah [name]  Procedure(s) Performed: COLONOSCOPY WITH PROPOFOL POLYPECTOMY INJECTION, SUBMUCOSAL CONTROL OF HEMORRHAGE, GI TRACT, ENDOSCOPIC, BY CLIPPING OR OVERSEWING  Patient location during evaluation: Phase II Anesthesia Type: General Level of consciousness: awake Pain management: pain level controlled Vital Signs Assessment: post-procedure vital signs reviewed and stable Respiratory status: spontaneous breathing and respiratory function stable Cardiovascular status: blood pressure returned to baseline and stable Postop Assessment: no headache and no apparent nausea or vomiting Anesthetic complications: no Comments: Late entry   No notable events documented.   Last Vitals:  Vitals:   03/14/23 0931 03/14/23 1124  BP: 102/80 120/81  Pulse: 62 85  Resp: 15 15  Temp: 36.7 C 36.7 C  SpO2: 96% 97%    Last Pain:  Vitals:   03/14/23 1124  TempSrc: Oral  PainSc: 0-No pain                 Windell Norfolk

## 2023-03-20 NOTE — Progress Notes (Signed)
 I reviewed the pathology results. Ann, can you send her a letter with the findings as described below please?  Repeat colonoscopy in 1 years  Thanks,  [name], MD Gastroenterology and Hepatology [facility]  ---------------------------------------------------------------------------------------------  [facility] [address], [address], [place], [place] [address] Phone:  [phone]   03/20/23 Savannah [name], [place]   Dear Savannah [name],  I am writing to inform you that the biopsies taken during your recent endoscopic examination showed:  A. COLON, ASCENDING, HEPATIC FLEXURE, TRANSVERSE, APPENDICEAL ORIFICE,  POLYPECTOMY:  - Tubular adenoma, fragments.   B. COLON, RECTOSIGMOID, POLYPECTOMY:  -  Tubulovillous adenoma.    What does this mean?  I am writing to let you know the results of your recent colonoscopy.  You had a total of 7 polyps removed. The pathology came back as "tubular and tubulovillious adenoma." These findings are NOT cancer, but had the polyps remained in your colon, they could have turned into cancer.  Given these findings, it is recommended that your next colonoscopy be performed in 1 year as one of the polyp was large and removed in pieces .  Also I value your feedback , so if you get a survey , please take the time to fill it out and thank you for choosing [facility]  Please call us at [phone] if you have persistent problems or have questions about your condition that have not been fully answered at this time.  Sincerely,  [name], MD Gastroenterology and Hepatology

## 2023-03-22 ENCOUNTER — Encounter (INDEPENDENT_AMBULATORY_CARE_PROVIDER_SITE_OTHER): Payer: Self-pay | Admitting: *Deleted

## 2023-05-21 ENCOUNTER — Other Ambulatory Visit: Payer: Self-pay | Admitting: Nurse Practitioner

## 2023-05-21 DIAGNOSIS — G43009 Migraine without aura, not intractable, without status migrainosus: Secondary | ICD-10-CM

## 2023-07-19 ENCOUNTER — Ambulatory Visit: Payer: BC Managed Care – PPO | Admitting: Nurse Practitioner

## 2023-07-21 ENCOUNTER — Other Ambulatory Visit: Payer: Self-pay | Admitting: Nurse Practitioner

## 2023-07-21 DIAGNOSIS — F330 Major depressive disorder, recurrent, mild: Secondary | ICD-10-CM

## 2023-07-21 DIAGNOSIS — Z720 Tobacco use: Secondary | ICD-10-CM

## 2023-07-24 ENCOUNTER — Ambulatory Visit: Admitting: Nurse Practitioner
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
## 2023-07-25 ENCOUNTER — Encounter: Payer: Self-pay | Admitting: Nurse Practitioner

## 2023-08-07 ENCOUNTER — Encounter: Payer: Self-pay | Admitting: Nurse Practitioner

## 2023-08-07 ENCOUNTER — Ambulatory Visit: Admitting: Nurse Practitioner
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
## 2023-08-16 ENCOUNTER — Ambulatory Visit: Admitting: Nurse Practitioner

## 2023-08-16 ENCOUNTER — Encounter: Payer: Self-pay | Admitting: Nurse Practitioner

## 2023-08-16 VITALS — BP 128/79 | HR 75 | Temp 98.1°F | Ht 63.0 in | Wt 102.2 lb

## 2023-08-16 DIAGNOSIS — Z23 Encounter for immunization: Secondary | ICD-10-CM | POA: Diagnosis not present

## 2023-08-16 DIAGNOSIS — F330 Major depressive disorder, recurrent, mild: Secondary | ICD-10-CM

## 2023-08-16 DIAGNOSIS — F4010 Social phobia, unspecified: Secondary | ICD-10-CM

## 2023-08-16 MED ORDER — BUPROPION HCL ER (XL) 150 MG PO TB24: 150.0000 mg | ORAL_TABLET | Freq: Every day | ORAL | 0 refills | Status: DC

## 2023-08-16 MED ORDER — HYDROXYZINE PAMOATE 25 MG PO CAPS
25.0000 mg | ORAL_CAPSULE | Freq: Three times a day (TID) | ORAL | 0 refills | Status: AC | PRN
Start: 2023-08-16 — End: ?

## 2023-10-02 ENCOUNTER — Other Ambulatory Visit: Payer: Self-pay | Admitting: Nurse Practitioner

## 2023-10-02 DIAGNOSIS — F419 Anxiety disorder, unspecified: Secondary | ICD-10-CM | POA: Insufficient documentation

## 2023-10-02 DIAGNOSIS — F4010 Social phobia, unspecified: Secondary | ICD-10-CM | POA: Insufficient documentation

## 2023-10-02 MED ORDER — BUSPIRONE HCL 7.5 MG PO TABS: 7.5000 mg | ORAL_TABLET | Freq: Three times a day (TID) | ORAL | 1 refills | Status: DC

## 2023-12-03 ENCOUNTER — Other Ambulatory Visit: Payer: Self-pay | Admitting: Nurse Practitioner

## 2023-12-03 DIAGNOSIS — F4010 Social phobia, unspecified: Secondary | ICD-10-CM

## 2023-12-03 DIAGNOSIS — G43009 Migraine without aura, not intractable, without status migrainosus: Secondary | ICD-10-CM

## 2023-12-03 DIAGNOSIS — F419 Anxiety disorder, unspecified: Secondary | ICD-10-CM

## 2023-12-14 NOTE — Progress Notes (Deleted)
 Subjective:  Patient ID: Savannah [name], female    DOB: [birth year], 58 y.o.   MRN: [id number]  Patient Care Team: Deitra Morton Sebastian Nena, NP as PCP - General (Nurse Practitioner)   Chief Complaint:  No chief complaint on file.   HPI: Savannah [name] is a 58 y.o. female presenting on 12/18/2023 for No chief complaint on file.   Discussed the use of AI scribe software for clinical note transcription with the patient, who gave verbal consent to proceed.  History of Present Illness        08/16/2023    3:58 PM 01/19/2023    8:23 AM  GAD 7 : Generalized Anxiety Score  Nervous, Anxious, on Edge 2 1  Control/stop worrying 2 1  Worry too much - different things 2 1  Trouble relaxing 1 1  Restless 1 0  Easily annoyed or irritable 2 1  Afraid - awful might happen 0 0  Total GAD 7 Score 10 5  Anxiety Difficulty Somewhat difficult Somewhat difficult        08/16/2023    3:58 PM 01/19/2023    8:21 AM  PHQ9 SCORE ONLY  PHQ-9 Total Score 6  9      Data saved with a previous flowsheet row definition     Relevant past medical, surgical, family, and social history reviewed and updated as indicated.  Allergies and medications reviewed and updated. Data reviewed: Chart in Epic.   Past Medical History:  Diagnosis Date   Migraine    since 1995 rare occurence    Past Surgical History:  Procedure Laterality Date   ABDOMINAL HYSTERECTOMY     AUGMENTATION MAMMAPLASTY Bilateral 1997   BREAST ENHANCEMENT SURGERY     BREAST EXCISIONAL BIOPSY Bilateral    breast tumors     CESAREAN SECTION     CHOLECYSTECTOMY     COLONOSCOPY WITH PROPOFOL  N/A 03/14/2023   Procedure: COLONOSCOPY WITH PROPOFOL ;  Surgeon: Cinderella Deatrice FALCON, MD;  Location: [facility];  Service: Endoscopy;  Laterality: N/A;  9:45AM;ASA 1-2   HEMOSTASIS CLIP PLACEMENT  03/14/2023   Procedure: CONTROL OF HEMORRHAGE, GI TRACT, ENDOSCOPIC, BY CLIPPING OR OVERSEWING;  Surgeon: Cinderella Deatrice FALCON, MD;  Location: [facility];  Service: Endoscopy;;   POLYPECTOMY  03/14/2023   Procedure: POLYPECTOMY;  Surgeon: Cinderella Deatrice FALCON, MD;  Location: [facility];  Service: Endoscopy;;   SUBMUCOSAL INJECTION  03/14/2023   Procedure: INJECTION, SUBMUCOSAL;  Surgeon: Cinderella Deatrice FALCON, MD;  Location: [facility];  Service: Endoscopy;;    Social History   Socioeconomic History   Marital status: Single    Spouse name: Not on file   Number of children: Not on file   Years of education: Not on file   Highest education level: Some college, no degree  Occupational History   Not on file  Tobacco Use   Smoking status: Former    Current packs/day: 1.00    Average packs/day: 1 pack/day for 28.0 years (28.0 ttl pk-yrs)    Types: Cigarettes   Smokeless tobacco: Never  Vaping Use   Vaping status: Every Day  Substance and Sexual Activity   Alcohol use: Not Currently    Comment: 2-3   Drug use: No   Sexual activity: Yes    Birth control/protection: Surgical  Other Topics Concern   Not on file  Social History Narrative   Caffiene dr pepper 1 liter   Working: advertising account planner   Social Drivers of Health  Tobacco Use: Medium Risk (08/16/2023)   Patient History    Smoking Tobacco Use: Former    Smokeless Tobacco Use: Never    Passive Exposure: Not on file  Financial Resource Strain: Low Risk (01/18/2023)   Overall Financial Resource Strain (CARDIA)    Difficulty of Paying Living Expenses: Not hard at all  Food Insecurity: Food Insecurity Present (01/18/2023)   Hunger Vital Sign    Worried About Running Out of Food in the Last Year: Sometimes true    Ran Out of Food in the Last Year: Sometimes true  Transportation Needs: No Transportation Needs (01/18/2023)   PRAPARE - Administrator, Civil Service (Medical): No    Lack of Transportation (Non-Medical): No  Physical Activity: Unknown (01/18/2023)   Exercise Vital Sign    Days of Exercise per Week: 0 days    Minutes of Exercise per Session: Not on  file  Stress: Stress Concern Present (01/18/2023)   [employer] of Occupational Health - Occupational Stress Questionnaire    Feeling of Stress : To some extent  Social Connections: Moderately Isolated (01/18/2023)   Social Connection and Isolation Panel    Frequency of Communication with Friends and Family: More than three times a week    Frequency of Social Gatherings with Friends and Family: Once a week    Attends Religious Services: Never    Database Administrator or Organizations: No    Attends Engineer, Structural: Not on file    Marital Status: Living with partner  Intimate Partner Violence: Not on file  Depression (PHQ2-9): Medium Risk (08/16/2023)   Depression (PHQ2-9)    PHQ-2 Score: 6  Alcohol Screen: Low Risk (01/18/2023)   Alcohol Screen    Last Alcohol Screening Score (AUDIT): 4  Housing: Low Risk (01/18/2023)   Housing Stability Vital Sign    Unable to Pay for Housing in the Last Year: No    Number of Times Moved in the Last Year: 0    Homeless in the Last Year: No  Utilities: Not on file  Health Literacy: Not on file    Outpatient Encounter Medications as of 12/18/2023  Medication Sig   acetaminophen  (TYLENOL ) 500 MG tablet Take 500 mg by mouth every 6 (six) hours as needed.   buPROPion  (WELLBUTRIN  XL) 150 MG 24 hr tablet Take 1 tablet (150 mg total) by mouth daily.   busPIRone  (BUSPAR ) 7.5 MG tablet TAKE 1 TABLET BY MOUTH 3 TIMES DAILY.   hydrOXYzine  (VISTARIL ) 25 MG capsule Take 1 capsule (25 mg total) by mouth every 8 (eight) hours as needed for anxiety.   Magnesium Gluconate 500 (27 Mg) MG TABS Take by mouth.   Multiple Vitamin (MULTIVITAMIN) tablet Take 1 tablet by mouth daily.   raNITIdine HCl (ZANTAC PO) Take by mouth.   rizatriptan  (MAXALT ) 5 MG tablet TAKE 1 TABLET BY MOUTH AS NEEDED FOR MIGRAINE. MAY REPEAT IN 2 HOURS IF NEEDED   No facility-administered encounter medications on file as of 12/18/2023.    Allergies[1]  Pertinent ROS per  HPI, otherwise unremarkable      Objective:  There were no vitals taken for this visit.   Wt Readings from Last 3 Encounters:  08/16/23 102 lb 3.2 oz (46.4 kg)  03/14/23 110 lb (49.9 kg)  02/15/23 105 lb (47.6 kg)    Physical Exam Physical Exam      Results for orders placed or performed in visit on 03/15/23  [facility] COLONOSCOPY   Collection Time: 03/14/23  12:00 AM  Result Value Ref Range   [facility] Colonoscopy See Report (in chart) See Report (in chart), Patient Reported       Pertinent labs & imaging results that were available during my care of the patient were reviewed by me and considered in my medical decision making.  Assessment & Plan:  There are no diagnoses linked to this encounter.   Assessment and Plan Assessment & Plan       Continue all other maintenance medications.  Follow up plan: No follow-ups on file.   Continue healthy lifestyle choices, including diet (rich in fruits, vegetables, and lean proteins, and low in salt and simple carbohydrates) and exercise (at least 30 minutes of moderate physical activity daily).  Educational handout given for ***  The above assessment and management plan was discussed with the patient. The patient verbalized understanding of and has agreed to the management plan. Patient is aware to call the clinic if they develop any new symptoms or if symptoms persist or worsen. Patient is aware when to return to the clinic for a follow-up visit. Patient educated on when it is appropriate to go to the emergency department.   Donne Baley [place] Thompson, DNP [facility] [address] [place], [place] [address] [phone]      [1] No Known Allergies

## 2023-12-18 ENCOUNTER — Ambulatory Visit: Payer: Self-pay | Admitting: Nurse Practitioner

## 2023-12-18 DIAGNOSIS — F17210 Nicotine dependence, cigarettes, uncomplicated: Secondary | ICD-10-CM

## 2024-01-01 ENCOUNTER — Other Ambulatory Visit: Payer: Self-pay | Admitting: Nurse Practitioner

## 2024-01-01 DIAGNOSIS — F330 Major depressive disorder, recurrent, mild: Secondary | ICD-10-CM
